# Patient Record
Sex: Male | Born: 1945 | Race: White | Hispanic: No | Marital: Single | Smoking: Never smoker
Health system: Southern US, Community
[De-identification: ages and names within clinical notes are randomized; demographics above are authoritative.]

## PROBLEM LIST (undated history)

## (undated) DIAGNOSIS — C4491 Basal cell carcinoma of skin, unspecified: Secondary | ICD-10-CM

## (undated) DIAGNOSIS — N419 Inflammatory disease of prostate, unspecified: Secondary | ICD-10-CM

## (undated) DIAGNOSIS — I10 Essential (primary) hypertension: Secondary | ICD-10-CM

## (undated) DIAGNOSIS — M1990 Unspecified osteoarthritis, unspecified site: Secondary | ICD-10-CM

## (undated) DIAGNOSIS — G4730 Sleep apnea, unspecified: Secondary | ICD-10-CM

## (undated) DIAGNOSIS — N400 Enlarged prostate without lower urinary tract symptoms: Secondary | ICD-10-CM

## (undated) DIAGNOSIS — D509 Iron deficiency anemia, unspecified: Secondary | ICD-10-CM

## (undated) DIAGNOSIS — E119 Type 2 diabetes mellitus without complications: Secondary | ICD-10-CM

## (undated) DIAGNOSIS — I2510 Atherosclerotic heart disease of native coronary artery without angina pectoris: Secondary | ICD-10-CM

## (undated) DIAGNOSIS — R361 Hematospermia: Secondary | ICD-10-CM

## (undated) DIAGNOSIS — D649 Anemia, unspecified: Secondary | ICD-10-CM

## (undated) DIAGNOSIS — E669 Obesity, unspecified: Secondary | ICD-10-CM

## (undated) DIAGNOSIS — E785 Hyperlipidemia, unspecified: Secondary | ICD-10-CM

## (undated) HISTORY — PX: CORONARY ANGIOPLASTY WITH STENT PLACEMENT: SHX49

## (undated) HISTORY — DX: Anemia, unspecified: D64.9

## (undated) HISTORY — PX: EYE SURGERY: SHX253

## (undated) HISTORY — DX: Type 2 diabetes mellitus without complications: E11.9

## (undated) HISTORY — DX: Obesity, unspecified: E66.9

## (undated) HISTORY — DX: Essential (primary) hypertension: I10

## (undated) HISTORY — DX: Hyperlipidemia, unspecified: E78.5

## (undated) HISTORY — DX: Hematospermia: R36.1

## (undated) HISTORY — DX: Benign prostatic hyperplasia without lower urinary tract symptoms: N40.0

## (undated) HISTORY — PX: OTHER SURGICAL HISTORY: SHX169

## (undated) HISTORY — DX: Inflammatory disease of prostate, unspecified: N41.9

## (undated) HISTORY — PX: GLAUCOMA SURGERY: SHX656

---

## 2005-01-06 ENCOUNTER — Ambulatory Visit: Payer: Self-pay | Admitting: Unknown Physician Specialty

## 2005-02-17 ENCOUNTER — Ambulatory Visit: Payer: Self-pay

## 2006-04-07 ENCOUNTER — Ambulatory Visit: Payer: Self-pay | Admitting: Unknown Physician Specialty

## 2006-10-03 ENCOUNTER — Ambulatory Visit: Payer: Self-pay

## 2008-01-21 ENCOUNTER — Ambulatory Visit: Payer: Self-pay | Admitting: Unknown Physician Specialty

## 2010-11-02 ENCOUNTER — Ambulatory Visit: Payer: Self-pay | Admitting: Unknown Physician Specialty

## 2011-09-15 ENCOUNTER — Ambulatory Visit: Payer: Self-pay | Admitting: Cardiology

## 2011-09-15 LAB — CK TOTAL AND CKMB (NOT AT ARMC): CK, Total: 133 U/L (ref 35–232)

## 2011-09-16 LAB — BASIC METABOLIC PANEL
Anion Gap: 10 (ref 7–16)
BUN: 13 mg/dL (ref 7–18)
Co2: 24 mmol/L (ref 21–32)
Creatinine: 0.53 mg/dL — ABNORMAL LOW (ref 0.60–1.30)
EGFR (African American): >60
EGFR (Non-African Amer.): >60
Glucose: 157 mg/dL — ABNORMAL HIGH (ref 65–99)
Osmolality: 283 (ref 275–301)

## 2011-12-21 DIAGNOSIS — R339 Retention of urine, unspecified: Secondary | ICD-10-CM | POA: Insufficient documentation

## 2011-12-21 DIAGNOSIS — N401 Enlarged prostate with lower urinary tract symptoms: Secondary | ICD-10-CM | POA: Insufficient documentation

## 2011-12-21 DIAGNOSIS — N411 Chronic prostatitis: Secondary | ICD-10-CM | POA: Insufficient documentation

## 2011-12-21 DIAGNOSIS — N509 Disorder of male genital organs, unspecified: Secondary | ICD-10-CM | POA: Insufficient documentation

## 2011-12-21 DIAGNOSIS — N138 Other obstructive and reflux uropathy: Secondary | ICD-10-CM | POA: Insufficient documentation

## 2013-02-20 ENCOUNTER — Ambulatory Visit: Payer: Self-pay | Admitting: Unknown Physician Specialty

## 2013-09-23 DIAGNOSIS — E785 Hyperlipidemia, unspecified: Secondary | ICD-10-CM | POA: Insufficient documentation

## 2013-10-27 DIAGNOSIS — I2510 Atherosclerotic heart disease of native coronary artery without angina pectoris: Secondary | ICD-10-CM | POA: Insufficient documentation

## 2013-11-05 ENCOUNTER — Ambulatory Visit: Payer: Self-pay | Admitting: Otolaryngology

## 2013-11-20 ENCOUNTER — Ambulatory Visit: Payer: Self-pay

## 2013-12-23 DIAGNOSIS — I10 Essential (primary) hypertension: Secondary | ICD-10-CM | POA: Insufficient documentation

## 2014-03-25 DIAGNOSIS — D649 Anemia, unspecified: Secondary | ICD-10-CM | POA: Insufficient documentation

## 2014-07-01 DIAGNOSIS — E782 Mixed hyperlipidemia: Secondary | ICD-10-CM | POA: Insufficient documentation

## 2014-10-02 DIAGNOSIS — E119 Type 2 diabetes mellitus without complications: Secondary | ICD-10-CM | POA: Insufficient documentation

## 2014-11-13 ENCOUNTER — Encounter: Payer: Self-pay | Admitting: *Deleted

## 2014-11-23 ENCOUNTER — Ambulatory Visit (INDEPENDENT_AMBULATORY_CARE_PROVIDER_SITE_OTHER): Payer: Medicare Other | Admitting: Urology

## 2014-11-23 ENCOUNTER — Encounter: Payer: Self-pay | Admitting: Urology

## 2014-11-23 VITALS — BP 129/72 | HR 72 | Ht 68.0 in | Wt 215.6 lb

## 2014-11-23 DIAGNOSIS — N401 Enlarged prostate with lower urinary tract symptoms: Secondary | ICD-10-CM

## 2014-11-23 DIAGNOSIS — R361 Hematospermia: Secondary | ICD-10-CM | POA: Insufficient documentation

## 2014-11-23 DIAGNOSIS — N138 Other obstructive and reflux uropathy: Secondary | ICD-10-CM | POA: Insufficient documentation

## 2014-11-23 LAB — BLADDER SCAN AMB NON-IMAGING

## 2014-11-23 MED ORDER — TAMSULOSIN HCL 0.4 MG PO CAPS: 0.4000 mg | ORAL_CAPSULE | Freq: Every day | ORAL | Status: DC

## 2014-11-23 MED ORDER — FINASTERIDE 5 MG PO TABS: 5.0000 mg | ORAL_TABLET | Freq: Every day | ORAL | Status: DC

## 2014-11-23 NOTE — Progress Notes (Signed)
11/23/2014 9:33 AM   Sherron Flemings [birth year] [id number]  Referring provider: No referring provider defined for this encounter.  Chief Complaint  Patient presents with  . Follow-up    6 month  . Benign Prostatic Hypertrophy    HPI: Mr. [place] is 69 year old white male with hematospermia and BPH with LUTS who presents today for a 6 month follow up.    BPH WITH LUTS: His IPSS score today is 16, which is moderate lower urinary tract symptomatology. He is mixed with his quality life due to his urinary symptoms. His PVR is 30 mL.  His previous IPSS score was 14/4.  His previous PVR is 18 mL.    His major complaint today having to use the restroom so often.   He has had these symptoms for several years.  He denies any dysuria, hematuria or suprapubic pain.   He currently taking tamsulosin and finasteride.  His has had a cystoscopic examination in 12/2012 by Dr. Jacqlyn Larsen and it was noted that the prostatic urethra demonstrated moderate bilobar prostatic hypertrophy, partial visual obstruction and prominent hypervascularity. Patient was placed on finasteride and was followed with PVR's.      He also denies any recent fevers, chills, nausea or vomiting.  He does not have a family history of PCa.      IPSS      11/23/14 0900       International Prostate Symptom Score   How often have you had the sensation of not emptying your bladder? Less than half the time     How often have you had to urinate less than every two hours? Almost always     How often have you found you stopped and started again several times when you urinated? Less than half the time     How often have you found it difficult to postpone urination? About half the time     How often have you had a weak urinary stream? Less than 1 in 5 times     How often have you had to strain to start urination? Less than 1 in 5 times     How many times did you typically get up at night to urinate? 2 Times     Total IPSS Score 16      Quality of Life due to urinary symptoms   If you were to spend the rest of your life with your urinary condition just the way it is now how would you feel about that? 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  When he reduced the dose of his ASA from 325 mg to 81 mg, hematospermia did abate.  He has since returned, but not at the frequency as it was before. He is not having pain with ejaculation.   PMH: Past Medical History  Diagnosis Date  . BPH (benign prostatic hyperplasia)   . Prostatitis   . Hematospermia   . Anemia   . Diabetes   . Hypertension   . HLD (hyperlipidemia)   . BPH (benign prostatic hyperplasia)   . Obesity     Surgical History: Past Surgical History  Procedure Laterality Date  . Rectal fissure repair    . Glaucoma surgery    . Coronary angioplasty with stent placement      Home Medications:    Medication List       This list is accurate as of: 11/23/14  9:33 AM.  Always use your most recent med list.               aspirin 81 MG tablet  Take 81 mg by mouth daily.     bimatoprost 0.01 % Soln  Commonly known as:  LUMIGAN  Apply to eye.     carvedilol 3.125 MG tablet  Commonly known as:  COREG  Take 3.125 mg by mouth 2 (two) times daily with a meal.     clopidogrel 75 MG tablet  Commonly known as:  PLAVIX  Take 75 mg by mouth daily.     COMBIGAN 0.2-0.5 % ophthalmic solution  Generic drug:  brimonidine-timolol     dorzolamide 2 % ophthalmic solution  Commonly known as:  TRUSOPT     DULoxetine 30 MG capsule  Commonly known as:  CYMBALTA  Take 2 capsules by mouth  once a day     ferrous gluconate 324 MG tablet  Commonly known as:  FERGON  Take 324 mg by mouth daily with breakfast.     ferrous sulfate 325 (65 FE) MG tablet  Take by mouth.     finasteride 5 MG tablet  Commonly known as:  PROSCAR  Take 5 mg by mouth daily.     gabapentin 300 MG capsule  Commonly known as:  NEURONTIN    Take 300 mg by mouth 3 (three) times daily.     glipiZIDE 5 MG tablet  Commonly known as:  GLUCOTROL  Take 5 mg by mouth daily before breakfast.     hydrochlorothiazide 12.5 MG capsule  Commonly known as:  MICROZIDE  TAKE ONE TABLET BY MOUTH EVERY DAY     ipratropium 0.03 % nasal spray  Commonly known as:  ATROVENT     lovastatin 40 MG tablet  Commonly known as:  MEVACOR  Take 40 mg by mouth at bedtime.     magnesium oxide 400 MG tablet  Commonly known as:  MAG-OX  Take by mouth.     metFORMIN 1000 MG tablet  Commonly known as:  GLUCOPHAGE  Take 1,000 mg by mouth 2 (two) times daily with a meal.     MULTI-VITAMINS Tabs  Take by mouth.     pantoprazole 40 MG tablet  Commonly known as:  PROTONIX  Take by mouth.     pioglitazone 45 MG tablet  Commonly known as:  ACTOS  Take 45 mg by mouth daily.     tamsulosin 0.4 MG Caps capsule  Commonly known as:  FLOMAX  Take 0.4 mg by mouth daily.     TRULICITY [place]  Inject into the skin.     valsartan 80 MG tablet  Commonly known as:  DIOVAN  Take 80 mg by mouth daily.        Allergies:  Allergies  Allergen Reactions  . Sulfa Antibiotics     Other reaction(s): UNSPECIFIED    Family History: Family History  Problem Relation Age of Onset  . Coronary artery disease Father   . Coronary artery disease Brother   . Nephrolithiasis Brother     Social History:  reports that he has never smoked. He does not have any smokeless tobacco history on file. He reports that he does not drink alcohol or use illicit drugs.  ROS: UROLOGY Frequent Urination?: Yes Hard to postpone urination?: Yes Burning/pain with urination?: No Get up at night to urinate?: Yes Leakage of urine?: No Urine stream starts and stops?: No Trouble starting stream?: No Do you have to strain to  urinate?: No Blood in urine?: No Urinary tract infection?: No Sexually transmitted disease?: No Injury to kidneys or bladder?: No Painful intercourse?:  No Weak stream?: No Erection problems?: Yes Penile pain?: No  Gastrointestinal Nausea?: No Vomiting?: No Indigestion/heartburn?: No Diarrhea?: No Constipation?: No  Constitutional Fever: No Night sweats?: No Weight loss?: No Fatigue?: No  Skin Skin rash/lesions?: No Itching?: No  Eyes Blurred vision?: No Double vision?: No  Ears/Nose/Throat Sore throat?: No Sinus problems?: No  Hematologic/Lymphatic Swollen glands?: No Easy bruising?: Yes  Cardiovascular Leg swelling?: No Chest pain?: No  Respiratory Cough?: No Shortness of breath?: No  Endocrine Excessive thirst?: No  Musculoskeletal Back pain?: No Joint pain?: No  Neurological Headaches?: No Dizziness?: No  Psychologic Depression?: No Anxiety?: No  Physical Exam: BP 129/72 mmHg  Pulse 72  Ht 5\' 8"  (1.727 m)  Wt 215 lb 9.6 oz (97.796 kg)  BMI 32.79 kg/m2  GU: Patient with uncircumcised phallus.  Foreskin easily retracted. Urethral meatus is patent.  No penile discharge. No penile lesions or rashes. Scrotum without lesions, cysts, rashes and/or edema.  Testicles are located scrotally bilaterally. No masses are appreciated in the testicles. Left and right epididymis are normal. Rectal: Patient with  normal sphincter tone. Perineum without scarring or rashes. No rectal masses are appreciated. Prostate is approximately 45 grams, no nodules are appreciated. Seminal vesicles are normal.   Laboratory Data:  Lab Results  Component Value Date   CREATININE 0.53* 09/16/2011    PSA   1.2 ng/mL on 05/25/2014  Pertinent Imaging: Results for orders placed or performed in visit on 11/23/14  BLADDER SCAN AMB NON-IMAGING  Result Value Ref Range   Scan Result 67ml     Assessment & Plan:    1. BPH (benign prostatic hyperplasia) with LUTS:   Patient's IPSS score is 16/3.  His PVR 30 mL.  His DRE demonstrates enlargement.    I discussed with the patient undergoing another cystoscopy with one of our  doctors for further evaluation of his frequency.  He stated his frequency was not that bothersome for him to undergo another cystoscopy.  He would like to wait until his symptoms worsen.   We will continue to monitor.   He will follow up in 6 months for a PSA, DRE, PVR and an IPSS.    - PSA - BLADDER SCAN AMB NON-IMAGING  2. Hematospermia:    I reassured the patient that this is a benign finding.   He still is quite bothered by seeing the blood in the ejaculate, but he appeared to satisfied with knowing that this is a benign finding.     Nursing note for RTC:      - IPSS score      - PVR      -PSA (should have been drawn prior to appointment)      -EXAM    No Follow-up on file.  Zara Council, [facility] [address] [place], [place] [address] [phone]

## 2014-11-24 ENCOUNTER — Telehealth: Payer: Self-pay

## 2014-11-24 LAB — PSA: PROSTATE SPECIFIC AG, SERUM: 0.5 ng/mL (ref 0.0–4.0)

## 2014-11-24 NOTE — Telephone Encounter (Signed)
-----   Message from Nori Riis, PA-C sent at 11/24/2014 11:22 AM EDT ----- Patient's PSA is stable.  We will see him in 6 months.  PSA to be drawn before his next appointment.

## 2014-11-24 NOTE — Telephone Encounter (Signed)
Spoke with pt in reference to PSA results. Pt voiced understanding.  

## 2014-11-25 ENCOUNTER — Other Ambulatory Visit: Payer: Self-pay

## 2014-11-25 DIAGNOSIS — N400 Enlarged prostate without lower urinary tract symptoms: Secondary | ICD-10-CM

## 2014-11-25 MED ORDER — TAMSULOSIN HCL 0.4 MG PO CAPS: 0.4000 mg | ORAL_CAPSULE | Freq: Every day | ORAL | Status: DC

## 2014-12-16 ENCOUNTER — Ambulatory Visit (INDEPENDENT_AMBULATORY_CARE_PROVIDER_SITE_OTHER): Payer: Medicare Other | Admitting: Urology

## 2014-12-16 ENCOUNTER — Encounter: Payer: Self-pay | Admitting: Urology

## 2014-12-16 VITALS — BP 93/55 | HR 41 | Ht 68.0 in | Wt 212.0 lb

## 2014-12-16 DIAGNOSIS — R300 Dysuria: Secondary | ICD-10-CM | POA: Diagnosis not present

## 2014-12-16 LAB — BLADDER SCAN AMB NON-IMAGING: Scan Result: 0

## 2014-12-16 MED ORDER — DOXYCYCLINE HYCLATE 100 MG PO CAPS: 100.0000 mg | ORAL_CAPSULE | Freq: Two times a day (BID) | ORAL | Status: DC

## 2014-12-16 NOTE — Progress Notes (Signed)
12/16/2014 2:26 PM   Brandon [place] June 11, 1945 [id number]  Referring provider: Glendon Axe, MD [place], [place] [address]  Chief Complaint  Patient presents with  . Dysuria    x 2 weeks     HPI: Patient is a 69 year old white male who presents today with 2 week history of  dysuria.  He is also experiencing urgency and nocturia 1. He denies any gross hematuria or suprapubic pain. He is having associated dizziness, but he denies fevers, chills, nausea or vomiting.  Patient was last seen in our office about one month ago for 6 month follow-up.  He was having urinary frequency at that time.  His PVR was 30 mL at that visit and he has been on finasteride and tamsulosin for several months.  He did not want to undergo cystoscopically for further evaluation of his symptoms. He wanted to wait until they were worse.  His PSA from that visit was 0.5 ng/mL.  Today his PVR is 0 mL.  His UA was unremarkable.  He has a history of chronic prostatitis and he states he believes he is having the onset of prostatitis.   PMH: Past Medical History  Diagnosis Date  . BPH (benign prostatic hyperplasia)   . Prostatitis   . Hematospermia   . Anemia   . Diabetes   . Hypertension   . HLD (hyperlipidemia)   . BPH (benign prostatic hyperplasia)   . Obesity     Surgical History: Past Surgical History  Procedure Laterality Date  . Rectal fissure repair    . Glaucoma surgery    . Coronary angioplasty with stent placement      Home Medications:    Medication List       This list is accurate as of: 12/16/14  2:26 PM.  Always use your most recent med list.               aspirin 81 MG tablet  Take 81 mg by mouth daily.     bimatoprost 0.01 % Soln  Commonly known as:  LUMIGAN  Apply to eye.     carvedilol 3.125 MG tablet  Commonly known as:  COREG  Take 3.125 mg by mouth 2 (two) times daily with a meal.     clopidogrel 75 MG tablet  Commonly known as:  PLAVIX    Take 75 mg by mouth daily.     COMBIGAN 0.2-0.5 % ophthalmic solution  Generic drug:  brimonidine-timolol     dorzolamide 2 % ophthalmic solution  Commonly known as:  TRUSOPT     doxycycline 100 MG capsule  Commonly known as:  VIBRAMYCIN  Take 1 capsule (100 mg total) by mouth every 12 (twelve) hours.     DULoxetine 30 MG capsule  Commonly known as:  CYMBALTA  Take 2 capsules by mouth  once a day     ferrous gluconate 324 MG tablet  Commonly known as:  FERGON  Take 324 mg by mouth daily with breakfast.     finasteride 5 MG tablet  Commonly known as:  PROSCAR  Take 1 tablet (5 mg total) by mouth daily.     gabapentin 300 MG capsule  Commonly known as:  NEURONTIN  Take 300 mg by mouth 3 (three) times daily.     glipiZIDE 5 MG 24 hr tablet  Commonly known as:  GLUCOTROL XL     hydrochlorothiazide 12.5 MG capsule  Commonly known as:  MICROZIDE  TAKE ONE TABLET BY  MOUTH EVERY DAY     ipratropium 0.03 % nasal spray  Commonly known as:  ATROVENT     latanoprost 0.005 % ophthalmic solution  Commonly known as:  XALATAN     lovastatin 40 MG tablet  Commonly known as:  MEVACOR  Take 40 mg by mouth at bedtime.     magnesium oxide 400 MG tablet  Commonly known as:  MAG-OX  Take by mouth.     metFORMIN 1000 MG tablet  Commonly known as:  GLUCOPHAGE  Take 1,000 mg by mouth 2 (two) times daily with a meal.     MULTI-VITAMINS Tabs  Take by mouth.     pantoprazole 40 MG tablet  Commonly known as:  PROTONIX  Take by mouth.     pioglitazone 45 MG tablet  Commonly known as:  ACTOS  Take 45 mg by mouth daily.     tamsulosin 0.4 MG Caps capsule  Commonly known as:  FLOMAX  Take 1 capsule (0.4 mg total) by mouth daily.     tamsulosin 0.4 MG Caps capsule  Commonly known as:  FLOMAX  Take 1 capsule (0.4 mg total) by mouth daily.     TRULICITY [place]  Inject into the skin.     TRULICITY 7.61 PJ/0.9TO Sopn  Generic drug:  Dulaglutide     valsartan 80 MG tablet   Commonly known as:  DIOVAN  Take 80 mg by mouth daily.        Allergies:  Allergies  Allergen Reactions  . Sulfa Antibiotics     Other reaction(s): UNSPECIFIED    Family History: Family History  Problem Relation Age of Onset  . Coronary artery disease Father   . Coronary artery disease Brother   . Nephrolithiasis Brother     Social History:  reports that he has never smoked. He does not have any smokeless tobacco history on file. He reports that he does not drink alcohol or use illicit drugs.  ROS: UROLOGY Frequent Urination?: No Hard to postpone urination?: Yes Burning/pain with urination?: Yes Get up at night to urinate?: Yes Leakage of urine?: No Urine stream starts and stops?: No Trouble starting stream?: No Do you have to strain to urinate?: No Blood in urine?: No Urinary tract infection?: No Sexually transmitted disease?: No Injury to kidneys or bladder?: No Painful intercourse?: No Weak stream?: No Erection problems?: No Penile pain?: No  Gastrointestinal Nausea?: No Vomiting?: No Indigestion/heartburn?: No Diarrhea?: No Constipation?: No  Constitutional Fever: No Night sweats?: No Weight loss?: No Fatigue?: No  Skin Skin rash/lesions?: No Itching?: No  Eyes Blurred vision?: No Double vision?: No  Ears/Nose/Throat Sore throat?: No Sinus problems?: No  Hematologic/Lymphatic Swollen glands?: No Easy bruising?: No  Cardiovascular Leg swelling?: No Chest pain?: No  Respiratory Cough?: No Shortness of breath?: No  Endocrine Excessive thirst?: No  Musculoskeletal Back pain?: No Joint pain?: No  Neurological Headaches?: No Dizziness?: Yes  Psychologic Depression?: No Anxiety?: No  Physical Exam: BP 93/55 mmHg  Pulse 41  Ht 5\' 8"  (1.727 m)  Wt 212 lb (96.163 kg)  BMI 32.24 kg/m2   Rectal: Patient with  normal sphincter tone. Perineum without scarring or rashes. No rectal masses are appreciated. Prostate is  approximately 50 grams, no nodules are appreciated. Seminal vesicles are normal.   Laboratory Data:  Lab Results  Component Value Date   CREATININE 0.53* 09/16/2011    Lab Results  Component Value Date   PSA 0.5 11/23/2014   Urinalysis:  Results for  orders placed or performed in visit on 12/16/14  Microscopic Examination  Result Value Ref Range   WBC, UA 0-5 0 -  5 /hpf   RBC, UA None seen 0 -  2 /hpf   Epithelial Cells (non renal) 0-10 0 - 10 /hpf   Casts Present (A) None seen /lpf   Cast Type Hyaline casts N/A   Mucus, UA Present (A) Not Estab.   Bacteria, UA None seen None seen/Few  Urinalysis, Complete  Result Value Ref Range   Specific Gravity, UA >1.030 (H) 1.005 - 1.030   pH, UA 5.0 5.0 - 7.5   Color, UA Yellow Yellow   Appearance Ur Clear Clear   Leukocytes, UA Negative Negative   Protein, UA Negative Negative/Trace   Glucose, UA Negative Negative   Ketones, UA 1+ (A) Negative   RBC, UA Negative Negative   Bilirubin, UA Positive (A) Negative   Urobilinogen, Ur 0.2 0.2 - 1.0 mg/dL   Nitrite, UA Negative Negative   Microscopic Examination See below:     Pertinent Imaging: Results for JUSTON, GOHEEN (MRN [id number]) as of 12/20/2014 17:10  Ref. Range 12/16/2014 14:09  Scan Result Unknown 0    Assessment & Plan:    1. Dysuria:   Patient states he is having symptoms reminiscent of chronic prostatitis that he has had in the past.  He states that his prostatitis has responded favorably to antibiotics.  I will prescribe doxycycline 100 mg twice daily for 30 days. Patient is advised of the increased photosensitivity with this antibiotic.  He will return in 1 month's time for repeat urinalysis and DRE.  - Urinalysis, Complete - Bladder Scan (Post Void Residual) in office   Return in about 1 month (around 01/15/2015) for DRE.  Zara Council, [facility] [address], [place], [place] [address] [phone]

## 2014-12-17 LAB — MICROSCOPIC EXAMINATION
BACTERIA UA: NONE SEEN
RBC, UA: NONE SEEN /hpf (ref 0–?)

## 2014-12-17 LAB — URINALYSIS, COMPLETE
BILIRUBIN UA: POSITIVE — AB
GLUCOSE, UA: NEGATIVE
LEUKOCYTES UA: NEGATIVE
Nitrite, UA: NEGATIVE
Protein, UA: NEGATIVE
RBC UA: NEGATIVE
Urobilinogen, Ur: 0.2 mg/dL (ref 0.2–1.0)
pH, UA: 5 (ref 5.0–7.5)

## 2014-12-20 DIAGNOSIS — R300 Dysuria: Secondary | ICD-10-CM | POA: Insufficient documentation

## 2015-01-21 ENCOUNTER — Encounter: Payer: Self-pay | Admitting: Urology

## 2015-01-21 ENCOUNTER — Ambulatory Visit (INDEPENDENT_AMBULATORY_CARE_PROVIDER_SITE_OTHER): Payer: Medicare Other | Admitting: Urology

## 2015-01-21 VITALS — BP 108/62 | HR 67 | Ht 68.0 in | Wt 217.4 lb

## 2015-01-21 DIAGNOSIS — N411 Chronic prostatitis: Secondary | ICD-10-CM | POA: Diagnosis not present

## 2015-01-21 LAB — URINALYSIS, COMPLETE
BILIRUBIN UA: NEGATIVE
LEUKOCYTES UA: NEGATIVE
Nitrite, UA: NEGATIVE
PH UA: 5 (ref 5.0–7.5)
PROTEIN UA: NEGATIVE
RBC UA: NEGATIVE
Specific Gravity, UA: 1.025 (ref 1.005–1.030)
Urobilinogen, Ur: 0.2 mg/dL (ref 0.2–1.0)

## 2015-01-21 LAB — MICROSCOPIC EXAMINATION
RBC, UA: NONE SEEN /hpf (ref 0–?)
RENAL EPITHEL UA: NONE SEEN /HPF
WBC, UA: NONE SEEN /hpf (ref 0–?)

## 2015-01-21 NOTE — Progress Notes (Signed)
01/21/2015 10:12 AM   Br[place] [birth year] [id number]  Referring provider: Glendon Axe, MD [facility] [place], [place] [address]  Chief Complaint  Patient presents with  . Prostatitis    one month follow up    HPI: Patient 69 year old white male who presents today after being placed on doxycycline for one month for prostatitis.  He was having dysuria, urgency and nocturia 1. He was having some associated dizziness, but he denied fevers, chills, nausea or vomiting. He also denied any gross hematuria or suprapubic pain associated with prostatitis.  Today, he states his symptoms have abated. His UA is unremarkable. He did not suffer any untoward side effects with the doxycycline.  He is still having intermittent brown colored semen.  He is not having any difficulty or pain with ejaculation.   PMH: Past Medical History  Diagnosis Date  . BPH (benign prostatic hyperplasia)   . Prostatitis   . Hematospermia   . Anemia   . Diabetes ([place])   . Hypertension   . HLD (hyperlipidemia)   . BPH (benign prostatic hyperplasia)   . Obesity     Surgical History: Past Surgical History  Procedure Laterality Date  . Rectal fissure repair    . Glaucoma surgery    . Coronary angioplasty with stent placement      Home Medications:    Medication List       This list is accurate as of: 01/21/15 10:12 AM.  Always use your most recent med list.               aspirin 81 MG tablet  Take 81 mg by mouth daily.     bimatoprost 0.01 % Soln  Commonly known as:  LUMIGAN  Apply to eye.     carvedilol 3.125 MG tablet  Commonly known as:  COREG  Take 3.125 mg by mouth 2 (two) times daily with a meal.     clopidogrel 75 MG tablet  Commonly known as:  PLAVIX  Take 75 mg by mouth daily.     COMBIGAN 0.2-0.5 % ophthalmic solution  Generic drug:  brimonidine-timolol     dorzolamide 2 % ophthalmic solution  Commonly known as:  TRUSOPT     doxycycline 100 MG capsule  Commonly known as:  VIBRAMYCIN  Take 1 capsule (100 mg total) by mouth every 12 (twelve) hours.     DULoxetine 30 MG capsule  Commonly known as:  CYMBALTA  Take 2 capsules by mouth  once a day     ferrous gluconate 324 MG tablet  Commonly known as:  FERGON  Take 324 mg by mouth daily with breakfast.     finasteride 5 MG tablet  Commonly known as:  PROSCAR  Take 1 tablet (5 mg total) by mouth daily.     gabapentin 300 MG capsule  Commonly known as:  NEURONTIN  Take 400 mg by mouth 3 (three) times daily.     glipiZIDE 5 MG 24 hr tablet  Commonly known as:  GLUCOTROL XL     hydrochlorothiazide 12.5 MG capsule  Commonly known as:  MICROZIDE  TAKE ONE TABLET BY MOUTH EVERY DAY     ipratropium 0.03 % nasal spray  Commonly known as:  ATROVENT     latanoprost 0.005 % ophthalmic solution  Commonly known as:  XALATAN     lovastatin 40 MG tablet  Commonly known as:  MEVACOR  Take 40 mg by mouth at bedtime.  magnesium oxide 400 MG tablet  Commonly known as:  MAG-OX  Take by mouth.     metFORMIN 1000 MG tablet  Commonly known as:  GLUCOPHAGE  Take 1,000 mg by mouth 2 (two) times daily with a meal.     MULTI-VITAMINS Tabs  Take by mouth.     pantoprazole 40 MG tablet  Commonly known as:  PROTONIX  Take by mouth.     pioglitazone 45 MG tablet  Commonly known as:  ACTOS  Take 45 mg by mouth daily.     tamsulosin 0.4 MG Caps capsule  Commonly known as:  FLOMAX  Take 1 capsule (0.4 mg total) by mouth daily.     tamsulosin 0.4 MG Caps capsule  Commonly known as:  FLOMAX  Take 1 capsule (0.4 mg total) by mouth daily.     TRULICITY [place]  Inject into the skin.     TRULICITY 1.09 NA/3.5TD Sopn  Generic drug:  Dulaglutide     valsartan 80 MG tablet  Commonly known as:  DIOVAN  Take 80 mg by mouth daily.        Allergies:  Allergies  Allergen Reactions  . Sulfa Antibiotics     Other reaction(s): UNSPECIFIED    Family  History: Family History  Problem Relation Age of Onset  . Coronary artery disease Father   . Coronary artery disease Brother   . Nephrolithiasis Brother   . Kidney disease Neg Hx   . Prostate cancer Neg Hx     Social History:  reports that he has never smoked. He does not have any smokeless tobacco history on file. He reports that he does not drink alcohol or use illicit drugs.  ROS: UROLOGY Frequent Urination?: Yes Hard to postpone urination?: Yes Burning/pain with urination?: No Get up at night to urinate?: Yes Leakage of urine?: No Urine stream starts and stops?: No Trouble starting stream?: No Do you have to strain to urinate?: No Blood in urine?: No Urinary tract infection?: No Sexually transmitted disease?: No Injury to kidneys or bladder?: No Painful intercourse?: No Weak stream?: No Erection problems?: No Penile pain?: No  Gastrointestinal Nausea?: No Vomiting?: No Indigestion/heartburn?: No Diarrhea?: No Constipation?: No  Constitutional Fever: No Night sweats?: No Weight loss?: No Fatigue?: No  Skin Skin rash/lesions?: No Itching?: No  Eyes Blurred vision?: No Double vision?: No  Ears/Nose/Throat Sore throat?: No Sinus problems?: No  Hematologic/Lymphatic Swollen glands?: No Easy bruising?: Yes  Cardiovascular Leg swelling?: No Chest pain?: No  Respiratory Cough?: No Shortness of breath?: No  Endocrine Excessive thirst?: No  Musculoskeletal Back pain?: No Joint pain?: Yes  Neurological Headaches?: No Dizziness?: No  Psychologic Depression?: No Anxiety?: No  Physical Exam: BP 108/62 mmHg  Pulse 67  Ht 5\' 8"  (1.727 m)  Wt 217 lb 6.4 oz (98.612 kg)  BMI 33.06 kg/m2  Rectal: Patient with  normal sphincter tone. Perineum without scarring or rashes. No rectal masses are appreciated. Prostate is approximately 45 grams, no nodules are appreciated. Seminal vesicles are normal.   Laboratory Data:  Lab Results  Component  Value Date   PSA 0.5 11/23/2014   Urinalysis: Results for orders placed or performed in visit on 01/21/15  Microscopic Examination  Result Value Ref Range   WBC, UA None seen 0 -  5 /hpf   RBC, UA None seen 0 -  2 /hpf   Epithelial Cells (non renal) 0-10 0 - 10 /hpf   Renal Epithel, UA None seen None seen /hpf  Bacteria, UA Few (A) None seen/Few  Urinalysis, Complete  Result Value Ref Range   Specific Gravity, UA 1.025 1.005 - 1.030   pH, UA 5.0 5.0 - 7.5   Color, UA Orange Yellow   Appearance Ur Clear Clear   Leukocytes, UA Negative Negative   Protein, UA Negative Negative/Trace   Glucose, UA Trace (A) Negative   Ketones, UA 1+ (A) Negative   RBC, UA Negative Negative   Bilirubin, UA Negative Negative   Urobilinogen, Ur 0.2 0.2 - 1.0 mg/dL   Nitrite, UA Negative Negative   Microscopic Examination See below:     Assessment & Plan:    1. Chronic prostatitis:   Patient's symptoms of prostatitis have abated with the doxycycline. He did not experience any side effects with the doxycycline. He return in 3 months for his IP assess, DRE and PSA.  - Urinalysis, Complete   Return in about 3 months (around 04/23/2015) for IPSS and DRE.  Zara Council, [facility] [address] [place], [place] [address] [phone]

## 2015-04-20 DIAGNOSIS — J3089 Other allergic rhinitis: Secondary | ICD-10-CM | POA: Insufficient documentation

## 2015-05-21 ENCOUNTER — Other Ambulatory Visit: Payer: Self-pay

## 2015-05-21 DIAGNOSIS — R972 Elevated prostate specific antigen [PSA]: Secondary | ICD-10-CM

## 2015-05-24 ENCOUNTER — Other Ambulatory Visit: Payer: Medicare Other

## 2015-05-24 DIAGNOSIS — R972 Elevated prostate specific antigen [PSA]: Secondary | ICD-10-CM

## 2015-05-25 LAB — PSA: Prostate Specific Ag, Serum: 0.6 ng/mL (ref 0.0–4.0)

## 2015-05-26 ENCOUNTER — Encounter: Payer: Self-pay | Admitting: Urology

## 2015-05-26 ENCOUNTER — Ambulatory Visit (INDEPENDENT_AMBULATORY_CARE_PROVIDER_SITE_OTHER): Payer: Medicare Other | Admitting: Urology

## 2015-05-26 VITALS — Ht 68.0 in | Wt 215.9 lb

## 2015-05-26 DIAGNOSIS — R361 Hematospermia: Secondary | ICD-10-CM

## 2015-05-26 DIAGNOSIS — N138 Other obstructive and reflux uropathy: Secondary | ICD-10-CM

## 2015-05-26 DIAGNOSIS — N401 Enlarged prostate with lower urinary tract symptoms: Secondary | ICD-10-CM | POA: Diagnosis not present

## 2015-05-26 MED ORDER — FINASTERIDE 5 MG PO TABS: 5.0000 mg | ORAL_TABLET | Freq: Every day | ORAL | Status: DC

## 2015-05-26 NOTE — Progress Notes (Signed)
10:09 AM   Brandon Maxwell 1945/06/17 [id number]  Referring provider: Glendon Axe, MD Brandon Maxwell [place], [place] [address]  Chief Complaint  Patient presents with  . Benign Prostatic Hypertrophy    6 month follow up    HPI: Brandon Maxwell is 70 year old white male with hematospermia and BPH with LUTS who presents today for a 6 month follow up.    BPH WITH LUTS: His IPSS score today is 15, which is moderate lower urinary tract symptomatology. He is mixed with his quality life due to his urinary symptoms. His previous PVR is 0 mL.  His major complaint today having to find a restroom he goes for long car rides.   He has had these symptoms for several years.  He denies any dysuria, hematuria or suprapubic pain.  He currently taking finasteride.  His has had a cystoscopic examination in 12/2012 by Brandon Maxwell and it was noted that the prostatic urethra demonstrated moderate bilobar prostatic hypertrophy, partial visual obstruction and prominent hypervascularity. Patient was placed on finasteride and was followed with PVR's.  He also denies any recent fevers, chills, nausea or vomiting.  He does not have a family history of PCa.      IPSS      05/26/15 1000       International Prostate Symptom Score   How often have you had the sensation of not emptying your bladder? Less than 1 in 5     How often have you had to urinate less than every two hours? About half the time     How often have you found you stopped and started again several times when you urinated? About half the time     How often have you found it difficult to postpone urination? About half the time     How often have you had a weak urinary stream? Less than half the time     How often have you had to strain to start urination? Less than half the time     How many times did you typically get up at night to urinate? 1 Time     Total IPSS Score 15     Quality of Life due to urinary symptoms   If  you were to spend the rest of your life with your urinary condition just the way it is now how would you feel about that? 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  When he reduced the dose of his ASA from 325 mg to 81 mg, hematospermia did abate.  Hematospermia is not at the frequency as it was before. He is not having pain or curvature with ejaculation.   PMH: Past Medical History  Diagnosis Date  . BPH (benign prostatic hyperplasia)   . Prostatitis   . Hematospermia   . Anemia   . Diabetes ([place])   . Hypertension   . HLD (hyperlipidemia)   . BPH (benign prostatic hyperplasia)   . Obesity     Surgical History: Past Surgical History  Procedure Laterality Date  . Rectal fissure repair    . Glaucoma surgery    . Coronary angioplasty with stent placement      Home Medications:    Medication List       This list is accurate as of: 05/26/15 10:09 AM.  Always use your most recent med list.  aspirin 81 MG tablet  Take 81 mg by mouth daily.     bimatoprost 0.01 % Soln  Commonly known as:  LUMIGAN  Apply to eye.     carvedilol 3.125 MG tablet  Commonly known as:  COREG  Take 3.125 mg by mouth 2 (two) times daily with a meal.     clopidogrel 75 MG tablet  Commonly known as:  PLAVIX  Take 75 mg by mouth daily.     COMBIGAN 0.2-0.5 % ophthalmic solution  Generic drug:  brimonidine-timolol     dorzolamide 2 % ophthalmic solution  Commonly known as:  TRUSOPT     doxycycline 100 MG capsule  Commonly known as:  VIBRAMYCIN  Take 1 capsule (100 mg total) by mouth every 12 (twelve) hours.     DULoxetine 30 MG capsule  Commonly known as:  CYMBALTA  Reported on 05/26/2015     ferrous gluconate 324 MG tablet  Commonly known as:  FERGON  Take 324 mg by mouth daily with breakfast.     finasteride 5 MG tablet  Commonly known as:  PROSCAR  Take 1 tablet (5 mg total) by mouth daily.     gabapentin 400 MG capsule    Commonly known as:  NEURONTIN     glipiZIDE 5 MG 24 hr tablet  Commonly known as:  GLUCOTROL XL     hydrochlorothiazide 12.5 MG capsule  Commonly known as:  MICROZIDE  TAKE ONE TABLET BY MOUTH EVERY DAY     ipratropium 0.03 % nasal spray  Commonly known as:  ATROVENT     latanoprost 0.005 % ophthalmic solution  Commonly known as:  XALATAN     levocetirizine 5 MG tablet  Commonly known as:  XYZAL  Take by mouth.     lovastatin 40 MG tablet  Commonly known as:  MEVACOR  Take 40 mg by mouth at bedtime.     magnesium oxide 400 MG tablet  Commonly known as:  MAG-OX  Take by mouth.     meloxicam 7.5 MG tablet  Commonly known as:  MOBIC     metFORMIN 1000 MG tablet  Commonly known as:  GLUCOPHAGE  Take 1,000 mg by mouth 2 (two) times daily with a meal. Reported on 05/26/2015     mometasone 0.1 % lotion  Commonly known as:  ELOCON  Reported on 05/26/2015     MULTI-VITAMINS Tabs  Take by mouth.     pantoprazole 40 MG tablet  Commonly known as:  PROTONIX  Take by mouth.     pioglitazone 45 MG tablet  Commonly known as:  ACTOS  Take 45 mg by mouth daily.     tamsulosin 0.4 MG Caps capsule  Commonly known as:  FLOMAX  Take 1 capsule (0.4 mg total) by mouth daily.     tamsulosin 0.4 MG Caps capsule  Commonly known as:  FLOMAX  Take 1 capsule (0.4 mg total) by mouth daily.     TRULICITY [place]  Inject into the skin.     TRULICITY A999333 0000000 Sopn  Generic drug:  Dulaglutide     valsartan 80 MG tablet  Commonly known as:  DIOVAN  Take 80 mg by mouth daily.        Allergies:  Allergies  Allergen Reactions  . Sulfa Antibiotics     Other reaction(s): UNSPECIFIED    Family History: Family History  Problem Relation Age of Onset  . Coronary artery disease Father   . Coronary artery disease Brother   .  Nephrolithiasis Brother   . Kidney disease Neg Hx   . Prostate cancer Neg Hx     Social History:  reports that he has never smoked. He does not have any  smokeless tobacco history on file. He reports that he does not drink alcohol or use illicit drugs.  ROS: UROLOGY Frequent Urination?: Yes Hard to postpone urination?: No Burning/pain with urination?: No Get up at night to urinate?: No Leakage of urine?: Yes Urine stream starts and stops?: Yes Trouble starting stream?: No Do you have to strain to urinate?: No Blood in urine?: No Urinary tract infection?: No Sexually transmitted disease?: No Injury to kidneys or bladder?: No Painful intercourse?: No Weak stream?: No Erection problems?: No Penile pain?: No  Gastrointestinal Nausea?: No Vomiting?: No Indigestion/heartburn?: No Diarrhea?: No Constipation?: No  Constitutional Fever: No Night sweats?: No Weight loss?: No Fatigue?: No  Skin Skin rash/lesions?: No Itching?: No  Eyes Blurred vision?: No Double vision?: No  Ears/Nose/Throat Sore throat?: No Sinus problems?: No  Hematologic/Lymphatic Swollen glands?: No Easy bruising?: No  Cardiovascular Leg swelling?: No Chest pain?: No  Respiratory Cough?: No Shortness of breath?: No  Endocrine Excessive thirst?: No  Musculoskeletal Back pain?: Yes Joint pain?: Yes  Neurological Headaches?: No Dizziness?: No  Psychologic Depression?: No Anxiety?: No  Physical Exam: Ht 5\' 8"  (1.727 m)  Wt 215 lb 14.4 oz (97.932 kg)  BMI 32.84 kg/m2  GU: Patient with uncircumcised phallus.  Foreskin easily retracted. Urethral meatus is patent.  No penile discharge. No penile lesions or rashes. Scrotum without lesions, cysts, rashes and/or edema.  Testicles are located scrotally bilaterally. No masses are appreciated in the testicles. Left and right epididymis are normal. Rectal: Patient with  normal sphincter tone. Perineum without scarring or rashes. No rectal masses are appreciated. Prostate is approximately 45 grams, no nodules are appreciated. Seminal vesicles are normal.   Laboratory Data:  Lab Results    Component Value Date   CREATININE 0.53* 09/16/2011    PSA   1.2 ng/mL on 05/25/2014            0.5 ng/mL on 11/23/2014 Results for orders placed or performed in visit on 05/24/15  PSA  Result Value Ref Range   Prostate Specific Ag, Serum 0.6 0.0 - 4.0 ng/mL    Assessment & Plan:    1. BPH (benign prostatic hyperplasia) with LUTS:   Patient's IPSS score is 15/3.  He will continue the finasteride.   We will continue to monitor.   He will follow up in 6 months for a PSA, exam and an IPSS score.     2. Hematospermia:    He is satisfied with knowing that this is a benign finding.     Return in about 6 months (around 11/23/2015) for IPSS score and exam.  Zara Council, [facility] [address], [place], [place] [address] [phone]

## 2015-06-23 ENCOUNTER — Other Ambulatory Visit: Payer: Self-pay | Admitting: Urology

## 2015-06-23 NOTE — Telephone Encounter (Signed)
Do you want pt to continue medication? I saw about the finasteride.

## 2015-07-30 DIAGNOSIS — R42 Dizziness and giddiness: Secondary | ICD-10-CM | POA: Insufficient documentation

## 2015-10-08 DIAGNOSIS — E7800 Pure hypercholesterolemia, unspecified: Secondary | ICD-10-CM | POA: Insufficient documentation

## 2015-11-17 ENCOUNTER — Other Ambulatory Visit: Payer: Self-pay

## 2015-11-17 DIAGNOSIS — N400 Enlarged prostate without lower urinary tract symptoms: Secondary | ICD-10-CM

## 2015-11-19 ENCOUNTER — Other Ambulatory Visit: Payer: Medicare Other

## 2015-11-19 DIAGNOSIS — N400 Enlarged prostate without lower urinary tract symptoms: Secondary | ICD-10-CM

## 2015-11-20 LAB — PSA: Prostate Specific Ag, Serum: 0.8 ng/mL (ref 0.0–4.0)

## 2015-11-23 ENCOUNTER — Ambulatory Visit: Payer: Medicare Other | Admitting: Urology

## 2015-11-23 ENCOUNTER — Ambulatory Visit (INDEPENDENT_AMBULATORY_CARE_PROVIDER_SITE_OTHER): Payer: Medicare Other | Admitting: Urology

## 2015-11-23 ENCOUNTER — Encounter: Payer: Self-pay | Admitting: Urology

## 2015-11-23 VITALS — BP 111/70 | HR 86 | Ht 68.0 in | Wt 211.1 lb

## 2015-11-23 DIAGNOSIS — R361 Hematospermia: Secondary | ICD-10-CM | POA: Diagnosis not present

## 2015-11-23 DIAGNOSIS — N401 Enlarged prostate with lower urinary tract symptoms: Secondary | ICD-10-CM | POA: Diagnosis not present

## 2015-11-23 DIAGNOSIS — N138 Other obstructive and reflux uropathy: Secondary | ICD-10-CM

## 2015-11-23 MED ORDER — TAMSULOSIN HCL 0.4 MG PO CAPS: 0.4000 mg | ORAL_CAPSULE | Freq: Every day | ORAL | 4 refills | Status: DC

## 2015-11-23 MED ORDER — FINASTERIDE 5 MG PO TABS: 5.0000 mg | ORAL_TABLET | Freq: Every day | ORAL | 4 refills | Status: DC

## 2015-11-23 NOTE — Progress Notes (Signed)
1:48 PM   Brandon Maxwell 03/06/1946 [id number]  Referring provider: Glendon Axe, MD Brandon Maxwell, [place] [address]  Chief Complaint  Patient presents with  . Follow-up    HPI: Brandon Maxwell is 70 year old Caucasian male with hematospermia and BPH with LUTS who presents today for a 6 month follow up.    BPH WITH LUTS: His IPSS score today is 10, which is moderate lower urinary tract symptomatology. He is mostly satisfied with his quality life due to his urinary symptoms. His previous IPSS score was 15/3.  His previous PVR is 0 mL.  His complaint today is frequency, urgency and nocturia.  These are not bothersome to him.  He denies any dysuria, hematuria or suprapubic pain.  He currently taking finasteride and tamsulosin.   His has had a cystoscopic examination in 12/2012 by Dr. Jacqlyn Larsen and it was noted that the prostatic urethra demonstrated moderate bilobar prostatic hypertrophy, partial visual obstruction and prominent hypervascularity. Patient was placed on finasteride and was followed with PVR's.  He also denies any recent fevers, chills, nausea or vomiting.  He does not have a family history of PCa.      IPSS    Row Name 11/23/15 1300         International Prostate Symptom Score   How often have you had the sensation of not emptying your bladder? Less than 1 in 5     How often have you had to urinate less than every two hours? About half the time     How often have you found you stopped and started again several times when you urinated? Less than 1 in 5 times     How often have you found it difficult to postpone urination? Less than half the time     How often have you had a weak urinary stream? Less than half the time     How often have you had to strain to start urination? Not at All     How many times did you typically get up at night to urinate? 1 Time     Total IPSS Score 10       Quality of Life due to urinary symptoms   If you were  to spend the rest of your life with your urinary condition just the way it is now how would you feel about that? Mostly Satisfied        Hematospermia: Patient has had intermittent episodes of hematospermia.  When he reduced the dose of his ASA from 325 mg to 81 mg, hematospermia did abate.  Hematospermia has abated. He is not having pain or curvature with ejaculation.   PMH: Past Medical History:  Diagnosis Date  . Anemia   . BPH (benign prostatic hyperplasia)   . BPH (benign prostatic hyperplasia)   . Diabetes ([place])   . Hematospermia   . HLD (hyperlipidemia)   . Hypertension   . Obesity   . Prostatitis     Surgical History: Past Surgical History:  Procedure Laterality Date  . CORONARY ANGIOPLASTY WITH STENT PLACEMENT    . GLAUCOMA SURGERY    . rectal fissure repair      Home Medications:    Medication List       Accurate as of 11/23/15  1:48 PM. Always use your most recent med list.          aspirin 81 MG tablet Take 81 mg by mouth daily.  bimatoprost 0.01 % Soln Commonly known as:  LUMIGAN Apply to eye.   carvedilol 3.125 MG tablet Commonly known as:  COREG Take 3.125 mg by mouth 2 (two) times daily with a meal.   clopidogrel 75 MG tablet Commonly known as:  PLAVIX Take 75 mg by mouth daily.   COMBIGAN 0.2-0.5 % ophthalmic solution Generic drug:  brimonidine-timolol   dorzolamide 2 % ophthalmic solution Commonly known as:  TRUSOPT   doxycycline 100 MG capsule Commonly known as:  VIBRAMYCIN Take 1 capsule (100 mg total) by mouth every 12 (twelve) hours.   DULoxetine 30 MG capsule Commonly known as:  CYMBALTA Reported on 05/26/2015   ferrous gluconate 324 MG tablet Commonly known as:  FERGON Take 324 mg by mouth daily with breakfast.   finasteride 5 MG tablet Commonly known as:  PROSCAR Take 1 tablet (5 mg total) by mouth daily.   gabapentin 400 MG capsule Commonly known as:  NEURONTIN   glipiZIDE 5 MG 24 hr tablet Commonly known as:   GLUCOTROL XL   hydrochlorothiazide 12.5 MG capsule Commonly known as:  MICROZIDE TAKE ONE TABLET BY MOUTH EVERY DAY   ipratropium 0.03 % nasal spray Commonly known as:  ATROVENT   latanoprost 0.005 % ophthalmic solution Commonly known as:  XALATAN   levocetirizine 5 MG tablet Commonly known as:  XYZAL Take by mouth.   lovastatin 40 MG tablet Commonly known as:  MEVACOR Take 40 mg by mouth at bedtime.   meloxicam 7.5 MG tablet Commonly known as:  MOBIC   metFORMIN 1000 MG tablet Commonly known as:  GLUCOPHAGE Take 1,000 mg by mouth 2 (two) times daily with a meal. Reported on 05/26/2015   mometasone 0.1 % lotion Commonly known as:  ELOCON Reported on 05/26/2015   MULTI-VITAMINS Tabs Take by mouth.   pantoprazole 40 MG tablet Commonly known as:  PROTONIX Take by mouth.   pioglitazone 45 MG tablet Commonly known as:  ACTOS Take 45 mg by mouth daily.   tamsulosin 0.4 MG Caps capsule Commonly known as:  FLOMAX Take 1 capsule by mouth  daily   tamsulosin 0.4 MG Caps capsule Commonly known as:  FLOMAX Take 1 capsule (0.4 mg total) by mouth daily.   TRULICITY [place] Inject into the skin.   TRULICITY A999333 0000000 Sopn Generic drug:  Dulaglutide   valsartan 80 MG tablet Commonly known as:  DIOVAN Take 80 mg by mouth daily.       Allergies:  Allergies  Allergen Reactions  . Sulfa Antibiotics     Other reaction(s): UNSPECIFIED    Family History: Family History  Problem Relation Age of Onset  . Coronary artery disease Father   . Coronary artery disease Brother   . Nephrolithiasis Brother   . Kidney disease Neg Hx   . Prostate cancer Neg Hx     Social History:  reports that he has never smoked. He has never used smokeless tobacco. He reports that he does not drink alcohol or use drugs.  ROS: UROLOGY Frequent Urination?: Yes Hard to postpone urination?: Yes Burning/pain with urination?: No Get up at night to urinate?: Yes Leakage of urine?:  No Urine stream starts and stops?: No Trouble starting stream?: No Do you have to strain to urinate?: No Blood in urine?: No Urinary tract infection?: No Sexually transmitted disease?: No Injury to kidneys or bladder?: No Painful intercourse?: No Weak stream?: No Erection problems?: Yes Penile pain?: No  Gastrointestinal Nausea?: No Vomiting?: No Indigestion/heartburn?: No Diarrhea?: No Constipation?:  No  Constitutional Fever: No Night sweats?: No Weight loss?: No Fatigue?: No  Skin Skin rash/lesions?: No Itching?: No  Eyes Blurred vision?: No Double vision?: No  Ears/Nose/Throat Sore throat?: No Sinus problems?: No  Hematologic/Lymphatic Swollen glands?: No Easy bruising?: Yes  Cardiovascular Leg swelling?: No Chest pain?: No  Respiratory Cough?: No Shortness of breath?: No  Endocrine Excessive thirst?: No  Musculoskeletal Back pain?: Yes Joint pain?: Yes  Neurological Headaches?: No Dizziness?: Yes  Psychologic Depression?: No Anxiety?: No  Physical Exam: BP 111/70   Pulse 86   Ht 5\' 8"  (1.727 m)   Wt 211 lb 1.6 oz (95.8 kg)   BMI 32.10 kg/m   GU: Patient with uncircumcised phallus.  Foreskin easily retracted. Urethral meatus is patent.  No penile discharge. No penile lesions or rashes. Scrotum without lesions, cysts, rashes and/or edema.  Testicles are located scrotally bilaterally. No masses are appreciated in the testicles. Left and right epididymis are normal. Rectal: Patient with  normal sphincter tone. Perineum without scarring or rashes. No rectal masses are appreciated. Prostate is approximately 45 grams, no nodules are appreciated. Seminal vesicles are normal.   Laboratory Data:  Lab Results  Component Value Date   CREATININE 0.53 (L) 09/16/2011    PSA   1.2 ng/mL on 05/25/2014            0.5 ng/mL on 11/23/2014 Results for orders placed or performed in visit on 11/19/15  PSA  Result Value Ref Range   Prostate Specific  Ag, Serum 0.8 0.0 - 4.0 ng/mL    Assessment & Plan:    1. BPH (benign prostatic hyperplasia) with LUTS:   Patient's IPSS score is 10/2.  He will continue the finasteride and tamsulosin.  Refills given.  He will follow up in 12 months for a PSA, exam and an IPSS score.     2. Hematospermia:    Resolved.    Return in about 1 year (around 11/22/2016) for IPSS, PSA and exam.  Zara Council, [facility]  [facility] [address] [place], [place] [address] [phone]

## 2016-08-24 ENCOUNTER — Other Ambulatory Visit: Payer: Self-pay | Admitting: Student

## 2016-08-24 DIAGNOSIS — K30 Functional dyspepsia: Secondary | ICD-10-CM

## 2016-08-25 ENCOUNTER — Other Ambulatory Visit
Admission: RE | Admit: 2016-08-25 | Discharge: 2016-08-25 | Disposition: A | Discharge status: Home / Self Care | Payer: Medicare Other | Source: Ambulatory Visit | Attending: Student | Admitting: Student

## 2016-08-25 DIAGNOSIS — R194 Change in bowel habit: Secondary | ICD-10-CM | POA: Insufficient documentation

## 2016-08-25 LAB — GASTROINTESTINAL PANEL BY PCR, STOOL (REPLACES STOOL CULTURE)
ADENOVIRUS F40/41: NOT DETECTED
Astrovirus: NOT DETECTED
CRYPTOSPORIDIUM: NOT DETECTED
CYCLOSPORA CAYETANENSIS: NOT DETECTED
Campylobacter species: NOT DETECTED
ENTEROAGGREGATIVE E COLI (EAEC): NOT DETECTED
ENTEROPATHOGENIC E COLI (EPEC): NOT DETECTED
Entamoeba histolytica: NOT DETECTED
Enterotoxigenic E coli (ETEC): NOT DETECTED
Giardia lamblia: NOT DETECTED
Norovirus GI/GII: NOT DETECTED
PLESIMONAS SHIGELLOIDES: NOT DETECTED
ROTAVIRUS A: NOT DETECTED
SHIGELLA/ENTEROINVASIVE E COLI (EIEC): NOT DETECTED
Salmonella species: NOT DETECTED
Sapovirus (I, II, IV, and V): NOT DETECTED
Shiga like toxin producing E coli (STEC): NOT DETECTED
VIBRIO SPECIES: NOT DETECTED
Vibrio cholerae: NOT DETECTED
YERSINIA ENTEROCOLITICA: NOT DETECTED

## 2016-08-25 LAB — C DIFFICILE QUICK SCREEN W PCR REFLEX
C DIFFICILE (CDIFF) INTERP: NOT DETECTED
C Diff antigen: NEGATIVE
C Diff toxin: NEGATIVE

## 2016-08-30 ENCOUNTER — Ambulatory Visit
Admission: RE | Admit: 2016-08-30 | Discharge: 2016-08-30 | Disposition: A | Discharge status: Home / Self Care | Payer: Medicare Other | Source: Ambulatory Visit | Attending: Student | Admitting: Student

## 2016-08-30 DIAGNOSIS — R066 Hiccough: Secondary | ICD-10-CM | POA: Diagnosis not present

## 2016-08-30 DIAGNOSIS — K30 Functional dyspepsia: Secondary | ICD-10-CM | POA: Diagnosis present

## 2016-08-30 LAB — PANCREATIC ELASTASE, FECAL: Pancreatic Elastase-1, Stool: >500 ug Elast./g (ref >200)

## 2016-10-14 DIAGNOSIS — G8929 Other chronic pain: Secondary | ICD-10-CM | POA: Insufficient documentation

## 2016-10-14 DIAGNOSIS — G4733 Obstructive sleep apnea (adult) (pediatric): Secondary | ICD-10-CM | POA: Insufficient documentation

## 2016-10-14 DIAGNOSIS — M79642 Pain in left hand: Secondary | ICD-10-CM

## 2016-10-14 DIAGNOSIS — Z9989 Dependence on other enabling machines and devices: Secondary | ICD-10-CM

## 2016-11-10 ENCOUNTER — Other Ambulatory Visit: Payer: Self-pay | Admitting: Urology

## 2016-11-14 ENCOUNTER — Other Ambulatory Visit: Payer: Self-pay

## 2016-11-14 DIAGNOSIS — N401 Enlarged prostate with lower urinary tract symptoms: Secondary | ICD-10-CM

## 2016-11-15 ENCOUNTER — Other Ambulatory Visit: Payer: Medicare Other

## 2016-11-15 DIAGNOSIS — N401 Enlarged prostate with lower urinary tract symptoms: Secondary | ICD-10-CM

## 2016-11-16 LAB — PSA: PROSTATE SPECIFIC AG, SERUM: 0.8 ng/mL (ref 0.0–4.0)

## 2016-11-21 NOTE — Progress Notes (Signed)
9:21 AM   Brandon Maxwell 05-31-1945 [id number]  Referring provider: Glendon Axe, MD [facility] [place], [place] [address]  Chief Complaint  Patient presents with  . Benign Prostatic Hypertrophy    1 year follow up    HPI: Brandon Maxwell is 71 year old Caucasian male with a history of hematospermia and BPH with LUTS who presents today for a 6 month follow up.    BPH WITH LUTS: His IPSS score today is 13, which is moderate lower urinary tract symptomatology. He is mixed with his quality life due to his urinary symptoms. His previous IPSS score was 10/2.  His previous PVR is 0 mL.  His major complaint(s) today are/is frequency, urgency, nocturia and a weak urinary stream.  He has been having intermittent suprapubic pain x 2 months.  He denies any dysuria and hematuria.   He currently taking finasteride and tamsulosin.   He is drinking a lot of sodas.  His UA today is negative.  His has had a cystoscopic examination in 12/2012 by Brandon Maxwell and it was noted that the prostatic urethra demonstrated moderate bilobar prostatic hypertrophy, partial visual obstruction and prominent hypervascularity. Patient was placed on finasteride and was followed with PVR's.  He also denies any recent fevers, chills, nausea or vomiting.  He does not have a family history of PCa.      IPSS    Row Name 11/22/16 0800         International Prostate Symptom Score   How often have you had the sensation of not emptying your bladder? Less than 1 in 5     How often have you had to urinate less than every two hours? Almost always     How often have you found you stopped and started again several times when you urinated? Less than half the time     How often have you found it difficult to postpone urination? Less than 1 in 5 times     How often have you had a weak urinary stream? Less than half the time     How often have you had to strain to start urination? Less than 1 in 5 times     How many times did you typically get up at night to urinate? 1 Time     Total IPSS Score 13       Quality of Life due to urinary symptoms   If you were to spend the rest of your life with your urinary condition just the way it is now how would you feel about that? 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  When he reduced the dose of his ASA from 325 mg to 81 mg, hematospermia did abate.  He is not having pain or curvature with ejaculation.   PMH: Past Medical History:  Diagnosis Date  . Anemia   . BPH (benign prostatic hyperplasia)   . BPH (benign prostatic hyperplasia)   . Diabetes ([place])   . Hematospermia   . HLD (hyperlipidemia)   . Hypertension   . Obesity   . Prostatitis     Surgical History: Past Surgical History:  Procedure Laterality Date  . CORONARY ANGIOPLASTY WITH STENT PLACEMENT    . GLAUCOMA SURGERY    . rectal fissure repair      Home Medications:  Allergies as of 11/22/2016      Reactions   Sulfa Antibiotics  Other reaction(s): UNSPECIFIED      Medication List       Accurate as of 11/22/16  9:21 AM. Always use your most recent med list.          aspirin 81 MG tablet Take 81 mg by mouth daily.   carvedilol 3.125 MG tablet Commonly known as:  COREG Take 3.125 mg by mouth 2 (two) times daily with a meal.   clopidogrel 75 MG tablet Commonly known as:  PLAVIX Take 75 mg by mouth daily.   clopidogrel 75 MG tablet Commonly known as:  PLAVIX TAKE 1 TABLET BY MOUTH ONCE DAILY   COMBIGAN 0.2-0.5 % ophthalmic solution Generic drug:  brimonidine-timolol   diclofenac 50 MG EC tablet Commonly known as:  VOLTAREN Take by mouth.   dorzolamide 2 % ophthalmic solution Commonly known as:  TRUSOPT   doxycycline 100 MG capsule Commonly known as:  VIBRAMYCIN Take 1 capsule (100 mg total) by mouth every 12 (twelve) hours.   DULoxetine 30 MG capsule Commonly known as:  CYMBALTA Reported on 05/26/2015     ferrous gluconate 324 MG tablet Commonly known as:  FERGON Take 324 mg by mouth daily with breakfast.   FIFTY50 PEN NEEDLES 31G X 5 MM Misc Generic drug:  Insulin Pen Needle Use as directed.   finasteride 5 MG tablet Commonly known as:  PROSCAR TAKE 1 TABLET BY MOUTH  DAILY   finasteride 5 MG tablet Commonly known as:  PROSCAR Take 1 tablet (5 mg total) by mouth daily.   gabapentin 400 MG capsule Commonly known as:  NEURONTIN   glipiZIDE 5 MG 24 hr tablet Commonly known as:  GLUCOTROL XL   hydrochlorothiazide 12.5 MG capsule Commonly known as:  MICROZIDE TAKE ONE TABLET BY MOUTH EVERY DAY   insulin glargine 100 UNIT/ML injection Commonly known as:  LANTUS Inject 40 Units into the skin.   ipratropium 0.03 % nasal spray Commonly known as:  ATROVENT   latanoprost 0.005 % ophthalmic solution Commonly known as:  XALATAN Apply to eye.   levocetirizine 5 MG tablet Commonly known as:  XYZAL Take by mouth.   levocetirizine 5 MG tablet Commonly known as:  XYZAL TAKE 1 TABLET BY MOUTH  EVERY EVENING   lovastatin 40 MG tablet Commonly known as:  MEVACOR Take 40 mg by mouth at bedtime.   magnesium oxide 400 MG tablet Commonly known as:  MAG-OX Take by mouth.   meloxicam 7.5 MG tablet Commonly known as:  MOBIC   metFORMIN 1000 MG tablet Commonly known as:  GLUCOPHAGE Take 1,000 mg by mouth 2 (two) times daily with a meal. Reported on 05/26/2015   mometasone 0.1 % lotion Commonly known as:  ELOCON Reported on 05/26/2015   MULTI-VITAMINS Tabs Take by mouth.   MULTI-VITAMINS Tabs Take by mouth.   pantoprazole 40 MG tablet Commonly known as:  PROTONIX Take by mouth.   pantoprazole 40 MG tablet Commonly known as:  PROTONIX TAKE ONE TABLET BY MOUTH EVERY DAY   pioglitazone 45 MG tablet Commonly known as:  ACTOS Take 45 mg by mouth daily.   tamsulosin 0.4 MG Caps capsule Commonly known as:  FLOMAX Take 1 capsule (0.4 mg total) by mouth daily.    tamsulosin 0.4 MG Caps capsule Commonly known as:  FLOMAX Take 1 capsule (0.4 mg total) by mouth daily.   TRULICITY [place] Inject into the skin.   TRULICITY 3.54 SF/6.8LE Sopn Generic drug:  Dulaglutide   valsartan 80 MG tablet Commonly known as:  DIOVAN Take 80 mg by mouth daily.            Discharge Care Instructions        Start     Ordered   11/22/16 0000  BLADDER SCAN AMB NON-IMAGING     11/22/16 0847   11/22/16 0000  Urinalysis, Complete     11/22/16 0903   11/22/16 0000  tamsulosin (FLOMAX) 0.4 MG CAPS capsule  Daily    Question:  Supervising Provider  Answer:  Hollice Espy   11/22/16 6269   11/22/16 0000  finasteride (PROSCAR) 5 MG tablet  Daily    Question:  Supervising Provider  Answer:  Hollice Espy   11/22/16 4854   11/22/16 0000  CT ABDOMEN PELVIS WO CONTRAST    Question Answer Comment  Preferred imaging location? [facility]   Radiology Contrast Protocol - do NOT remove file path [email or web address]      11/22/16 0920      Allergies:  Allergies  Allergen Reactions  . Sulfa Antibiotics     Other reaction(s): UNSPECIFIED    Family History: Family History  Problem Relation Age of Onset  . Coronary artery disease Father   . Coronary artery disease Brother   . Nephrolithiasis Brother   . Kidney disease Neg Hx   . Prostate cancer Neg Hx   . Kidney cancer Neg Hx   . Bladder Cancer Neg Hx     Social History:  reports that he has never smoked. He has never used smokeless tobacco. He reports that he does not drink alcohol or use drugs.  ROS: UROLOGY Frequent Urination?: Yes Hard to postpone urination?: Yes Burning/pain with urination?: No Get up at night to urinate?: Yes Leakage of urine?: No Urine stream starts and stops?: No Trouble starting stream?: No Do you have to strain to urinate?: No Blood in urine?: No Urinary tract infection?: No Sexually transmitted disease?: No Injury to kidneys or  bladder?: No Painful intercourse?: No Weak stream?: Yes Erection problems?: Yes Penile pain?: No  Gastrointestinal Nausea?: No Vomiting?: No Indigestion/heartburn?: No Diarrhea?: No Constipation?: No  Constitutional Fever: No Night sweats?: No Weight loss?: No Fatigue?: No  Skin Skin rash/lesions?: No Itching?: No  Eyes Blurred vision?: No Double vision?: No  Ears/Nose/Throat Sore throat?: No Sinus problems?: No  Hematologic/Lymphatic Swollen glands?: No Easy bruising?: Yes  Cardiovascular Leg swelling?: No Chest pain?: No  Respiratory Cough?: No Shortness of breath?: No  Endocrine Excessive thirst?: No  Musculoskeletal Back pain?: Yes Joint pain?: No  Neurological Headaches?: No Dizziness?: No  Psychologic Depression?: No Anxiety?: No  Physical Exam: BP 131/75   Pulse 66   Ht 5\' 8"  (1.727 m)   Wt 204 lb 6.4 oz (92.7 kg)   BMI 31.08 kg/m   GU: Patient with uncircumcised phallus.  Foreskin easily retracted. Urethral meatus is patent.  No penile discharge. No penile lesions or rashes. Scrotum without lesions, cysts, rashes and/or edema.  Testicles are located scrotally bilaterally. No masses are appreciated in the testicles. Left and right epididymis are normal. Rectal: Patient with  normal sphincter tone. Perineum without scarring or rashes. No rectal masses are appreciated. Prostate is approximately 45 grams, no nodules are appreciated. Seminal vesicles are normal.   Laboratory Data: PSA   1.2 ng/mL on 05/25/2014            0.5 ng/mL on 11/23/2014            0.8 ng/mL on 11/19/2015  0.8 ng/mL on 11/15/2016  Urinalysis Unremarkable. See EPIC.  Pertinent Imaging Results for orders placed or performed in visit on 11/22/16  BLADDER SCAN AMB NON-IMAGING  Result Value Ref Range   Scan Result 0    I have reviewed the labs  Assessment & Plan:    1. BPH (benign prostatic hyperplasia) with LUTS:   Patient's IPSS score is 13/3.   It is slightly worse.  He will continue the finasteride and tamsulosin.  Refills given.  He will follow up in 12 months for a PSA, exam and an IPSS score.    2. Hematospermia:    Resolved.   3. Suprapubic pain  - UA is negative  - Patient encouraged to eliminate soda from his diet as it is a bladder irritant  - schedule CT scan for further evaluation - I will call with results    Return in about 1 year (around 11/22/2017) for IPSS, PSA and exam.  Zara Council, [facility]  [place] [address], [place], [place] [address] [phone]

## 2016-11-22 ENCOUNTER — Ambulatory Visit (INDEPENDENT_AMBULATORY_CARE_PROVIDER_SITE_OTHER): Payer: Medicare Other | Admitting: Urology

## 2016-11-22 ENCOUNTER — Encounter: Payer: Self-pay | Admitting: Urology

## 2016-11-22 VITALS — BP 131/75 | HR 66 | Ht 68.0 in | Wt 204.4 lb

## 2016-11-22 DIAGNOSIS — N138 Other obstructive and reflux uropathy: Secondary | ICD-10-CM | POA: Diagnosis not present

## 2016-11-22 DIAGNOSIS — N401 Enlarged prostate with lower urinary tract symptoms: Secondary | ICD-10-CM | POA: Diagnosis not present

## 2016-11-22 DIAGNOSIS — R102 Pelvic and perineal pain: Secondary | ICD-10-CM

## 2016-11-22 DIAGNOSIS — R109 Unspecified abdominal pain: Secondary | ICD-10-CM

## 2016-11-22 LAB — URINALYSIS, COMPLETE
BILIRUBIN UA: NEGATIVE
Glucose, UA: NEGATIVE
KETONES UA: NEGATIVE
Leukocytes, UA: NEGATIVE
Nitrite, UA: NEGATIVE
PH UA: 7.5 (ref 5.0–7.5)
RBC, UA: NEGATIVE
SPEC GRAV UA: 1.02 (ref 1.005–1.030)
UUROB: 0.2 mg/dL (ref 0.2–1.0)

## 2016-11-22 LAB — BLADDER SCAN AMB NON-IMAGING: SCAN RESULT: 0

## 2016-11-22 MED ORDER — TAMSULOSIN HCL 0.4 MG PO CAPS: 0.4000 mg | ORAL_CAPSULE | Freq: Every day | ORAL | 6 refills | Status: DC

## 2016-11-22 MED ORDER — FINASTERIDE 5 MG PO TABS: 5.0000 mg | ORAL_TABLET | Freq: Every day | ORAL | 3 refills | Status: DC

## 2016-12-06 ENCOUNTER — Ambulatory Visit
Admission: RE | Admit: 2016-12-06 | Discharge: 2016-12-06 | Disposition: A | Discharge status: Home / Self Care | Payer: Medicare Other | Source: Ambulatory Visit | Attending: Urology | Admitting: Urology

## 2016-12-06 DIAGNOSIS — K8020 Calculus of gallbladder without cholecystitis without obstruction: Secondary | ICD-10-CM | POA: Insufficient documentation

## 2016-12-06 DIAGNOSIS — K5730 Diverticulosis of large intestine without perforation or abscess without bleeding: Secondary | ICD-10-CM | POA: Diagnosis not present

## 2016-12-06 DIAGNOSIS — R109 Unspecified abdominal pain: Secondary | ICD-10-CM

## 2016-12-13 ENCOUNTER — Telehealth: Payer: Self-pay | Admitting: Urology

## 2016-12-13 DIAGNOSIS — R102 Pelvic and perineal pain: Secondary | ICD-10-CM

## 2016-12-13 NOTE — Telephone Encounter (Signed)
Please advise 

## 2016-12-13 NOTE — Telephone Encounter (Signed)
Patient notified and in agreement for PT referral  Order for PT placed

## 2016-12-13 NOTE — Telephone Encounter (Signed)
Patient's CT did not demonstrate any kidney stones or any findings that would explain his urinary symptoms.  He does have gall stones.  I would recommend PT for his suprapubic pain at this time.

## 2016-12-13 NOTE — Telephone Encounter (Signed)
Pt called asking about his CT results.  Please give pt a call [phone].

## 2017-01-09 ENCOUNTER — Ambulatory Visit: Payer: Medicare Other | Attending: Urology | Admitting: Physical Therapy

## 2017-01-09 DIAGNOSIS — M6281 Muscle weakness (generalized): Secondary | ICD-10-CM | POA: Insufficient documentation

## 2017-01-09 DIAGNOSIS — M791 Myalgia, unspecified site: Secondary | ICD-10-CM | POA: Diagnosis not present

## 2017-01-09 DIAGNOSIS — M6208 Separation of muscle (nontraumatic), other site: Secondary | ICD-10-CM

## 2017-01-09 NOTE — Patient Instructions (Addendum)
    Deep core level 2

## 2017-01-10 NOTE — Therapy (Signed)
[place] MAIN [facility] SERVICES [address] [place], [place], [address] Phone: [phone]   Fax:  [phone]  Physical Therapy Evaluation  Patient Details  Name: Brandon Maxwell MRN: [id number] Date of Birth: 14-Apr-1945 No Data Recorded  Encounter Date: 01/09/2017    Past Medical History:  Diagnosis Date  . Anemia   . BPH (benign prostatic hyperplasia)   . BPH (benign prostatic hyperplasia)   . Diabetes ([place])   . Hematospermia   . HLD (hyperlipidemia)   . Hypertension   . Obesity   . Prostatitis     Past Surgical History:  Procedure Laterality Date  . CORONARY ANGIOPLASTY WITH STENT PLACEMENT    . GLAUCOMA SURGERY    . rectal fissure repair      There were no vitals filed for this visit.       Subjective Assessment - 01/10/17 2217    Subjective Pt reported pain located in the middle of pubic bone 3 months ago. The pain comes on when sitting and it comes and goes.  Pt has no pain curerntly.  A CAT scan was done and nothing showed up except gallstones. The gallstones have not cleared. The pain level 2/10 and has not changed.  Pt reports sometimes there is a burning with urination once in a while before the pubic bone pain.   Pt states his semen is a dark color for several years after he bled while wearing a catheter during an overnight stay at the hospital for a stent placement in his heart 7 years ago.   Pt's urologist is aware of this.  Denied pain with ejaculation, erection, nor urination.    Pt's bowel movements are hardly ever solid.  Pt has had loose stools for a couple of years. [place] Stool Type 7.  Daily fluid intake: soft drinks 1-2  ( 12 fl oz) cans, 2 - 12 fl oz water.   Pt had anal fissures with surgery 15 years ago. Since that surgery, pt has has blood when he wipes on in his underwear < 25 % of the time.     Pertinent History Hx of herniated disk in back, anal fissures with surgery   Patient Stated Goals Pt would  like to get rid of gall stones but is not sure what PT can do for it.              [facility] PT Assessment - 01/10/17 2147      Assessment   Medical Diagnosis (P)  suprapubic pain   Referring Provider (P)  Zara Council     Precautions   Precautions (P)  None     Restrictions   Weight Bearing Restrictions (P)  No     Balance Screen   Has the patient fallen in the past 6 months (P)  No     Observation/Other Assessments   Observations (P)  ankles crossed     Coordination   Gross Motor Movements are Fluid and Coordinated (P)  --  diaphragmatic excursion limited   Fine Motor Movements are Fluid and Coordinated (P)  --  abdominal / pelvic floor straining            Objective measurements completed on examination: See above findings.        Pelvic Floor Special Questions - 01/10/17 2146    Diastasis Recti 3 fingers width along linea alba ( abdominal bulging with head lift     Pelvic Floor Internal Exam pt was explained  the details of the exam and pt consented verbally without ocntraindications    Exam Type Rectal   Palpation increased tensions . tenderness at R side of puborectalismm ( post Tx: decreased) . poor lumbopelvic rhythm for anterior tilt            [facility] Adult PT Treatment/Exercise - 01/10/17 2146      Therapeutic Activites    Therapeutic Activities --  see pt instructions      Neuro Re-ed    Neuro Re-ed Details  see pt instructions      Manual Therapy   Internal Pelvic Floor STM with coordination breathing and pelvic tilts along R puborectalis mm                      PT Long Term Goals - 01/09/17 1412      PT LONG TERM GOAL #1   Title Pt will report decreased days when the pubic bone pain comes on from 2-3 days/ week to < 2 days in order to minimize pubic pain and be able to sit with more confort   Time 12   Period Weeks   Status New     PT LONG TERM GOAL #2   Title Pt will demo no R sided pelvic floor (puborectalis mm  tensions) in order to improve GI and bowel function     Time 12   Period Weeks   Status New     PT LONG TERM GOAL #3   Title Pt will demo decreasded fingers separation of 3 fingers to < 1 fingers width along linea alba in order to improve postural stability, minimize supinc pain, and improve motility   Time 12   Period Weeks   Status New     PT LONG TERM GOAL #4   Title Pt will decrease his Jackson score from 21% to < 16% in order to improve QOL   Time 12   Period Weeks   Status New     PT LONG TERM GOAL #5   Title Pt will decrease his COREFO score from 20% to < 15% in order to restore bowel function and less diarhea   Time 12   Period Weeks   Status New     Additional Long Term Goals   Additional Long Term Goals Yes     PT LONG TERM GOAL #6   Title Pt will report improved stool consistency from Type 7 to 4-5 [facility] Stool Type) in order to minimize excessive wiping   Time 12   Period Weeks   Status New                Plan - 01/10/17 2139    Clinical Impression Statement Pt is 71 yo male who reports suprapubic pain, loose stools, frequent wiping of anus with bleeding, and dark colored semen.  These impact his QOL. Pt's clincial presentation showed abdominal separation, dyscoordination of pelvic floor mm with overuse of abdominal mm straining, and overactive pelvic floor mm. Suspect pt's tight pelvic floor mm may be associated with pt's Hx of surgerical repair of anal fissues   Suspect possibility of straining to be associated with dark colored semen because pt reported this condition had started after he was catheterized for a cardiac procedure years ago. Pt states urologist has been made aware of this condition and PT plans to f/u with urologist and pt.  Pt has undergone tests for his abdominal pain which showed presence of gall stones and  he is interested in understanding how to address his gallstones. Pt was educated to consult his MD or a nutritionist because this issue is  outside of the scope of PT.     From a musculoskeletal perspective, pt's sx made be due to a poor intra-abdominal pressure system and dyscoordination of deep core mm. Today, pt addressed his diastasis recti, limited diaphragmatic excursion, and tight pelvic floor mm. Following Tx which included manual Tx over abdominal mm and intrarectal Tx, pt demo'd improved abdominal closure / Co-activation of TrA with diaprhagm and pelvic floor, and less pelvic floor mm tensions. Pt had no complaints during and after Tx.     History and Personal Factors relevant to plan of care: gallstones, anal fissures and surgeries, co-morbidities   Clinical Presentation Evolving   Rehab Potential Good   PT Frequency 1x / week   PT Duration 12 weeks   PT Treatment/Interventions Patient/family education;Therapeutic activities;Therapeutic exercise;Stair training;Moist Heat;Neuromuscular re-education;Manual lymph drainage;Manual techniques;Scar mobilization;Energy conservation;Traction;Gait training;Functional mobility training   Consulted and Agree with Plan of Care Patient      Patient will benefit from skilled therapeutic intervention in order to improve the following deficits and impairments:  Increased fascial restricitons, Postural dysfunction, Pain, Improper body mechanics, Decreased scar mobility, Decreased endurance, Decreased coordination, Decreased range of motion, Decreased mobility, Increased muscle spasms, Impaired sensation, Decreased activity tolerance, Decreased strength  Visit Diagnosis: Myalgia  Muscle weakness (generalized)  Diastasis recti      G-Codes - 2017-01-14 1431    Functional Assessment Tool Used (Outpatient Only) clinjcal judgement, PDI and COREFO     Functional Limitation Self care   Self Care Current Status (O9629) At least 20 percent but less than 40 percent impaired, limited or restricted   Self Care Goal Status (B2841) At least 1 percent but less than 20 percent impaired, limited or  restricted       Problem List Patient Active Problem List   Diagnosis Date Noted  . Pure hypercholesterolemia 10/08/2015  . Dizziness 07/30/2015  . Perennial allergic rhinitis 04/20/2015  . Dysuria 12/20/2014  . BPH with obstruction/lower urinary tract symptoms 11/23/2014  . Hematospermia 11/23/2014  . Type 2 diabetes mellitus without complications ([place]) 32/44/0102  . Hypomagnesemia 07/01/2014  . Combined fat and carbohydrate induced hyperlipemia 07/01/2014  . Anemia, unspecified 03/25/2014  . Essential hypertension 12/23/2013  . Atherosclerotic heart disease of native coronary artery without angina pectoris 10/27/2013  . HLD (hyperlipidemia) 09/23/2013  . Benign prostatic hyperplasia with urinary obstruction 12/21/2011  . Chronic prostatitis 12/21/2011  . Disorder of male genital organs 12/21/2011  . Incomplete bladder emptying 12/21/2011  . Retention of urine 12/21/2011    Jerl Mina ,PT, DPT, E-RYT  01/10/2017, 10:20 PM  [place] MAIN [facility] SERVICES [address] [place], [place], [address] Phone: [phone]   Fax:  [phone]  Name: Brandon Maxwell MRN: [id number] Date of Birth: [birth year]

## 2017-01-23 ENCOUNTER — Ambulatory Visit: Payer: Medicare Other | Admitting: Physical Therapy

## 2017-01-23 DIAGNOSIS — M6208 Separation of muscle (nontraumatic), other site: Secondary | ICD-10-CM

## 2017-01-23 DIAGNOSIS — M791 Myalgia, unspecified site: Secondary | ICD-10-CM

## 2017-01-23 DIAGNOSIS — M6281 Muscle weakness (generalized): Secondary | ICD-10-CM

## 2017-01-23 NOTE — Patient Instructions (Addendum)
TO LOOSEN UP MIDBACK AND SHOULDERS   Open book ( pillow between knees and behind back ) 3/4 turn   arm swings   Cross arms and "taking off sweater" motion   shoudler roll backwards     TO STRENGTHEN ABDOMINAL MUSCLES  "Pulling seat belt"  In chair   Band over door knob with knot on the other side of the door Stand perpendicular to the door,  Exhale to pull band from R ear height across body to the L pocket without turning pelvis and knees  Follow hand with eyes/head  10 x 2 reps      Deep core with knee out

## 2017-01-24 NOTE — Therapy (Signed)
[place] MAIN [facility] SERVICES [address] [place], [place], [address] Phone: [phone]   Fax:  [phone]  Physical Therapy Treatment  Patient Details  Name: Brandon Maxwell MRN: [id number] Date of Birth: November 04, 1945 No Data Recorded  Encounter Date: 01/23/2017      PT End of Session - 01/23/17 0917    Visit Number 2   Number of Visits 12   Authorization Type g code    PT Start Time 0803   PT Stop Time 0856   PT Time Calculation (min) 53 min   Activity Tolerance Patient tolerated treatment well;No increased pain      Past Medical History:  Diagnosis Date  . Anemia   . BPH (benign prostatic hyperplasia)   . BPH (benign prostatic hyperplasia)   . Diabetes ([place])   . Hematospermia   . HLD (hyperlipidemia)   . Hypertension   . Obesity   . Prostatitis     Past Surgical History:  Procedure Laterality Date  . CORONARY ANGIOPLASTY WITH STENT PLACEMENT    . GLAUCOMA SURGERY    . rectal fissure repair      There were no vitals filed for this visit.      Subjective Assessment - 01/23/17 0808    Subjective Pt reported no changes to his pubic pain.    Pertinent History Hx of herniated disk in back, anal fissures with surgery   Patient Stated Goals Pt would like to get rid of gall stones but is not sure what PT can do for it.              [facility] PT Assessment - 01/23/17 0918      Observation/Other Assessments   Observations lumbar lordosis with PNF D2 ext- withheld exercise .  Added D1 Felxion instead seated to promote oblique activitation      Palpation   Spinal mobility increased thoracic mm tightness along T3-12 B with limited scapular mobility                  Pelvic Floor Special Questions - 01/23/17 0919    Diastasis Recti 3 fingers below sternum ( no fingers width below umbilicus compared to last visit,( less abdominal bulging with head crunch post Tx)            [facility] Adult PT Treatment/Exercise  - 01/23/17 0920      Neuro Re-ed    Neuro Re-ed Details  see pt instructions      Exercises   Exercises --  see pt isntructions     Manual Therapy   Manual therapy comments STM/ MWM along paraspinals of thoracic,  fascial releases with gentle pressure over distal sternum/ intercostals to faciliate lateral excursion and depresison of ribcage                 PT Education - 01/23/17 0911    Education provided Yes   Education Details HEP   Person(s) Educated Patient   Methods Explanation;Demonstration;Tactile cues;Handout;Verbal cues   Comprehension Returned demonstration;Verbalized understanding             PT Long Term Goals - 01/09/17 1412      PT LONG TERM GOAL #1   Title Pt will report decreased days when the pubic bone pain comes on from 2-3 days/ week to < 2 days in order to minimize pubic pain and be able to sit with more confort   Time 12   Period Weeks   Status New  PT LONG TERM GOAL #2   Title Pt will demo no R sided pelvic floor (puborectalis mm tensions) in order to improve GI and bowel function     Time 12   Period Weeks   Status New     PT LONG TERM GOAL #3   Title Pt will demo decreasded fingers separation of 3 fingers to < 1 fingers width along linea alba in order to improve postural stability, minimize supinc pain, and improve motility   Time 12   Period Weeks   Status New     PT LONG TERM GOAL #4   Title Pt will decrease his [place] score from 21% to < 16% in order to improve QOL   Time 12   Period Weeks   Status New     PT LONG TERM GOAL #5   Title Pt will decrease his COREFO score from 20% to < 15% in order to restore bowel function and less diarhea   Time 12   Period Weeks   Status New     Additional Long Term Goals   Additional Long Term Goals Yes     PT LONG TERM GOAL #6   Title Pt will report improved stool consistency from Type 7 to 4-5 [facility] Stool Type) in order to minimize excessive wiping   Time 12   Period Weeks    Status New               Plan - 01/23/17 1005    Clinical Impression Statement Pt demo'd inmproved diastasis recti compared to his first visit but required manual Tx to address upper abdominal separation today. Pt demo'd increased thoracic mobility with increased diaphragmatic excursion and depression for better abdominal co-activation with deep core mm.  Added oblique strengthening to also improved intraabdominal pressure system. Pt continues to benefit from skilled PT.   Rehab Potential Good   PT Frequency 1x / week   PT Duration 12 weeks   PT Treatment/Interventions Patient/family education;Therapeutic activities;Therapeutic exercise;Stair training;Moist Heat;Neuromuscular re-education;Manual lymph drainage;Manual techniques;Scar mobilization;Energy conservation;Traction;Gait training;Functional mobility training   Consulted and Agree with Plan of Care Patient      Patient will benefit from skilled therapeutic intervention in order to improve the following deficits and impairments:  Increased fascial restricitons, Postural dysfunction, Pain, Improper body mechanics, Decreased scar mobility, Decreased endurance, Decreased coordination, Decreased range of motion, Decreased mobility, Increased muscle spasms, Impaired sensation, Decreased activity tolerance, Decreased strength  Visit Diagnosis: Muscle weakness (generalized)  Diastasis recti  Myalgia     Problem List Patient Active Problem List   Diagnosis Date Noted  . Pure hypercholesterolemia 10/08/2015  . Dizziness 07/30/2015  . Perennial allergic rhinitis 04/20/2015  . Dysuria 12/20/2014  . BPH with obstruction/lower urinary tract symptoms 11/23/2014  . Hematospermia 11/23/2014  . Type 2 diabetes mellitus without complications ([place]) 16/96/7893  . Hypomagnesemia 07/01/2014  . Combined fat and carbohydrate induced hyperlipemia 07/01/2014  . Anemia, unspecified 03/25/2014  . Essential hypertension 12/23/2013  .  Atherosclerotic heart disease of native coronary artery without angina pectoris 10/27/2013  . HLD (hyperlipidemia) 09/23/2013  . Benign prostatic hyperplasia with urinary obstruction 12/21/2011  . Chronic prostatitis 12/21/2011  . Disorder of male genital organs 12/21/2011  . Incomplete bladder emptying 12/21/2011  . Retention of urine 12/21/2011    Jerl Mina  ,PT, DPT, E-RYT  01/24/2017, 1:58 PM  [place] MAIN [facility] SERVICES [address] [place], [place], [address] Phone: [phone]   Fax:  [phone]  Name: Brandon Maxwell MRN: [id number] Date of Birth: July 11, 1945

## 2017-01-31 ENCOUNTER — Ambulatory Visit: Payer: Medicare Other | Admitting: Physical Therapy

## 2017-01-31 DIAGNOSIS — M791 Myalgia, unspecified site: Secondary | ICD-10-CM | POA: Diagnosis not present

## 2017-01-31 DIAGNOSIS — M6281 Muscle weakness (generalized): Secondary | ICD-10-CM

## 2017-01-31 DIAGNOSIS — M6208 Separation of muscle (nontraumatic), other site: Secondary | ICD-10-CM

## 2017-01-31 NOTE — Patient Instructions (Addendum)
Deep core level 1 ( breathing , add quick pelvic floor squeeze. Avoid pushing with abdominal muscles)    Deep core level 2 ( make sure there is no wobbling of the pelvis, press strongly with opposite leg and hip when other moves)    ________  Stretches for pelvic floor: Laying on your back with a towel under opposite thigh of the thigh you want to stretch Cross __  ankle over __  Thigh  Inhale, feel pelvic floor lengthen/diaphragm expand left and right laterally Exhale, bring thighs closer to chest by pulling towel with hands. Keep shoulders and neck down on the pillow and relaxed.   5 breaths       Abdominal massage upward from L,  R , center to belly button 3 stroke x 3 , pressure is gentle and light with all fingers flat, not using finger tips

## 2017-01-31 NOTE — Therapy (Signed)
[place] MAIN [facility] SERVICES [address] [place], [place], [address] Phone: [phone]   Fax:  [phone]  Physical Therapy Treatment  Patient Details  Name: Brandon Maxwell MRN: [id number] Date of Birth: [birth year] No Data Recorded  Encounter Date: 01/31/2017      PT End of Session - 01/31/17 0848    Visit Number 3   Number of Visits 12   Authorization Type g code    PT Start Time 0803   PT Stop Time 5643   PT Time Calculation (min) 54 min   Activity Tolerance Patient tolerated treatment well;No increased pain      Past Medical History:  Diagnosis Date  . Anemia   . BPH (benign prostatic hyperplasia)   . BPH (benign prostatic hyperplasia)   . Diabetes ([place])   . Hematospermia   . HLD (hyperlipidemia)   . Hypertension   . Obesity   . Prostatitis     Past Surgical History:  Procedure Laterality Date  . CORONARY ANGIOPLASTY WITH STENT PLACEMENT    . GLAUCOMA SURGERY    . rectal fissure repair      There were no vitals filed for this visit.      Subjective Assessment - 01/31/17 0808    Subjective Pt reports his pubic pain left for three days after leaving last session. It came back at the same level of discomfort.  Pt feels he is moving around a little better.    Pertinent History Hx of herniated disk in back, anal fissures with surgery   Patient Stated Goals Pt would like to get rid of gall stones but is not sure what PT can do for it.              [facility] PT Assessment - 01/31/17 0846      Coordination   Gross Motor Movements are Fluid and Coordinated --  abdominal straining w/ cue for pelvic floor contraction      Palpation   Spinal mobility limited L rib excursion                  Pelvic Floor Special Questions - 01/31/17 0843    External Perineal Exam pt consented verbally    External Palpation increased tensions and tenderness at mons pubis, ischiocavernosus L > R  ( decreased post Tx)    Grade 2 contraction, initially w/ abdominal straining            [facility] Adult PT Treatment/Exercise - 01/31/17 0844      Neuro Re-ed    Neuro Re-ed Details  see pt instructions      Manual Therapy   Manual therapy comments STM at problem areas noted in assessment   fascial releases over L ribs/ sternum/ intercorstals                 PT Education - 01/31/17 0848    Education provided Yes   Education Details HEP   Person(s) Educated Patient   Methods Explanation;Demonstration;Tactile cues;Verbal cues;Handout   Comprehension Verbalized understanding;Returned demonstration             PT Long Term Goals - 01/31/17 0853      PT LONG TERM GOAL #1   Title Pt will report decreased days when the pubic bone pain comes on from 2-3 days/ week to < 2 days in order to minimize pubic pain and be able to sit with more confort   Time 12   Period Weeks  Status Achieved     PT LONG TERM GOAL #2   Title Pt will demo no R sided pelvic floor (puborectalis mm tensions) in order to improve GI and bowel function     Time 12   Period Weeks   Status Partially Met     PT LONG TERM GOAL #3   Title Pt will demo decreasded fingers separation of 3 fingers to < 2 fingers width along linea alba in order to improve postural stability, minimize supinc pain, and improve motility   Time 12   Period Weeks   Status Partially Met     PT LONG TERM GOAL #4   Title Pt will decrease his PDI score from 21% to < 16% in order to improve QOL   Time 12   Period Weeks   Status On-going     PT LONG TERM GOAL #5   Title Pt will decrease his COREFO score from 20% to < 15% in order to restore bowel function and less diarhea   Time 12   Period Weeks   Status On-going     PT LONG TERM GOAL #6   Title Pt will report improved stool consistency from Type 7 to 4-5 [facility] Stool Type) in order to minimize excessive wiping   Time 12   Period Weeks   Status Achieved               Plan -  01/31/17 0849    Clinical Impression Statement Pt has met 2/6 goals on this 3rd visit. Pt is having more solid forming stools instead of loose stools. Pt did not have pubic pain/discomfort for 3 days after last session. Pt continues to demo improved intraabdominal pressure system with increased diaphragmatic excursion and stronger deep core mm and less abdominal bulging/ diastasis recti.  Today, pt demo'd decreased  tenderness/ tensions at supra pubic area , ischiocavernosus, and deep transverse perineal mm following external manual Tx. Pt demo'd correct pelvic floor contraction without straining of abdominal mm. Suspect pt will continue to progress well with today's Tx and it is likely that pt's rectus abdominus ( distal attachment) and anterior pelvic floor mm are involved in his Sx. Plan to continue to increase mobility of these tissues while improving his intraabdominal pressure system/deep core coordination and strength. Pt continues to benefit from skilled PT.    Rehab Potential Good   PT Frequency 1x / week   PT Duration 12 weeks   PT Treatment/Interventions Patient/family education;Therapeutic activities;Therapeutic exercise;Stair training;Moist Heat;Neuromuscular re-education;Manual lymph drainage;Manual techniques;Scar mobilization;Energy conservation;Traction;Gait training;Functional mobility training   Consulted and Agree with Plan of Care Patient      Patient will benefit from skilled therapeutic intervention in order to improve the following deficits and impairments:  Increased fascial restricitons, Postural dysfunction, Pain, Improper body mechanics, Decreased scar mobility, Decreased endurance, Decreased coordination, Decreased range of motion, Decreased mobility, Increased muscle spasms, Impaired sensation, Decreased activity tolerance, Decreased strength  Visit Diagnosis: Myalgia     Problem List Patient Active Problem List   Diagnosis Date Noted  . Pure hypercholesterolemia  10/08/2015  . Dizziness 07/30/2015  . Perennial allergic rhinitis 04/20/2015  . Dysuria 12/20/2014  . BPH with obstruction/lower urinary tract symptoms 11/23/2014  . Hematospermia 11/23/2014  . Type 2 diabetes mellitus without complications ([facility]) 10/02/2014  . Hypomagnesemia 07/01/2014  . Combined fat and carbohydrate induced hyperlipemia 07/01/2014  . Anemia, unspecified 03/25/2014  . Essential hypertension 12/23/2013  . Atherosclerotic heart disease of native coronary artery without angina pectoris  10/27/2013  . HLD (hyperlipidemia) 09/23/2013  . Benign prostatic hyperplasia with urinary obstruction 12/21/2011  . Chronic prostatitis 12/21/2011  . Disorder of male genital organs 12/21/2011  . Incomplete bladder emptying 12/21/2011  . Retention of urine 12/21/2011    Jerl Mina ,PT, DPT, E-RYT  01/31/2017, 8:56 AM  [place] MAIN [facility] SERVICES [address] [place], [place], [address] Phone: [phone]   Fax:  [phone]  Name: Brandon Maxwell MRN: [id number] Date of Birth: [birth year]

## 2017-02-06 ENCOUNTER — Ambulatory Visit: Payer: Medicare Other | Attending: Urology | Admitting: Physical Therapy

## 2017-02-06 DIAGNOSIS — M6208 Separation of muscle (nontraumatic), other site: Secondary | ICD-10-CM | POA: Diagnosis present

## 2017-02-06 DIAGNOSIS — R278 Other lack of coordination: Secondary | ICD-10-CM | POA: Diagnosis not present

## 2017-02-06 DIAGNOSIS — M6281 Muscle weakness (generalized): Secondary | ICD-10-CM | POA: Insufficient documentation

## 2017-02-06 DIAGNOSIS — M791 Myalgia, unspecified site: Secondary | ICD-10-CM | POA: Insufficient documentation

## 2017-02-06 NOTE — Patient Instructions (Signed)
Seated :  Shoulder retraction squeezes 3 sec, 10 reps   X 3 days

## 2017-02-06 NOTE — Therapy (Addendum)
[place] MAIN [facility] SERVICES [address] [place], [place], [address] Phone: [phone]   Fax:  [phone]  Physical Therapy Treatment  Patient Details  Name: Brandon Maxwell MRN: [id number] Date of Birth: 11-22-1945 No Data Recorded  Encounter Date: 02/06/2017  PT End of Session - 02/06/17 0820    Visit Number  4    Number of Visits  12    Authorization Type  g code     PT Start Time  0803    PT Stop Time  0821    PT Time Calculation (min)  18 min    Activity Tolerance  Patient tolerated treatment well;No increased pain       Past Medical History:  Diagnosis Date  . Anemia   . BPH (benign prostatic hyperplasia)   . BPH (benign prostatic hyperplasia)   . Diabetes ([place])   . Hematospermia   . HLD (hyperlipidemia)   . Hypertension   . Obesity   . Prostatitis     Past Surgical History:  Procedure Laterality Date  . CORONARY ANGIOPLASTY WITH STENT PLACEMENT    . GLAUCOMA SURGERY    . rectal fissure repair      There were no vitals filed for this visit.  Subjective Assessment - 02/06/17 0819    Subjective  Pt arrived to PT complaining of getting a "charlie horse" L calf 3 days ago while walking on the track. It has persisted for the past 3 days despite stretching. It hurts when walking on it. Pt reports his suprapubic pain has not bothered him him this past week since last session                 Pertinent History  Hx of herniated disk in back, anal fissures with surgery    Patient Stated Goals  Pt would like to get rid of gall stones but is not sure what PT can do for it.                 Assessment: L calf tightness compared to R, Tenderness reported with squeeze at medial level of calf.  Gait slightly antalgic      Treatment: Progression  To promote upright spine/ less forward head position  with retraction of scapular squeezes 3 sec, x 10 in seated position            PT Education - 02/06/17 0820     Education provided  Yes    Education Details  HEP    Person(s) Educated  Patient    Comprehension  Verbalized understanding;Returned demonstration;Verbal cues required;Tactile cues required          PT Long Term Goals - 02/06/17 0821      PT LONG TERM GOAL #1   Title  Pt will report decreased days when the pubic bone pain comes on from 2-3 days/ week to < 2 days in order to minimize pubic pain and be able to sit with more confort    Time  12    Period  Weeks    Status  Achieved      PT LONG TERM GOAL #2   Title  Pt will demo no R sided pelvic floor (puborectalis mm tensions) in order to improve GI and bowel function      Time  12    Period  Weeks    Status  Partially Met      PT LONG TERM GOAL #3   Title  Pt will demo decreasded fingers separation of 3 fingers to < 2 fingers width along linea alba in order to improve postural stability, minimize supinc pain, and improve motility    Time  12    Period  Weeks    Status  Partially Met      PT LONG TERM GOAL #4   Title  Pt will decrease his [place] score from 21% to < 16% in order to improve QOL    Time  12    Period  Weeks    Status  On-going      PT LONG TERM GOAL #5   Title  Pt will decrease his COREFO score from 20% to < 15% in order to restore bowel function and less diarhea    Time  12    Period  Weeks    Status  On-going      PT LONG TERM GOAL #6   Title  Pt will report improved stool consistency from Type 7 to 4-5 [facility] Stool Type) in order to minimize excessive wiping    Time  12    Period  Weeks    Status  Achieved            Plan - 02/06/17 9417    Clinical Impression Statement Pt is making progress with report of no pain after last session which focused on releasing pelvic floor muscles. Pt continues to show improved diastasis recti w/ stronger deep core mm. Progressed pt to seated shoulder retraction squeezes to promote less forward head posture and thoracic kyphosis. Additional Tx was withheld due to  the reason below.  Pt continues to benefit from skilled PT. Plan to reassess goals at next session.  Today's visit was abbreviated because pt complained of getting a "charlie horse" L calf 3 days ago while walking on the track. It came on suddenly and has persisted for the past 3 days despite stretching. It hurts when walking on it. Upon palpation by PT, L calf had increased tightness compared to R and pt reported tenderness when medial calf was squeezed. PT informed staff at pt's PCP's clinic re: Sx and requested that pt get assessed and r/o DVT. Pt was scheduled for an appt this morning with a PA 9:30am. Pt was escorted in wheelchair by PT to clinic.    PT plans to f/u on pt's case and modify POC as needed or recommended by PCP.               Rehab Potential  Good    PT Frequency  1x / week    PT Duration  12 weeks    PT Treatment/Interventions  Patient/family education;Therapeutic activities;Therapeutic exercise;Stair training;Moist Heat;Neuromuscular re-education;Manual lymph drainage;Manual techniques;Scar mobilization;Energy conservation;Traction;Gait training;Functional mobility training    Consulted and Agree with Plan of Care  Patient       Patient will benefit from skilled therapeutic intervention in order to improve the following deficits and impairments:  Increased fascial restricitons, Postural dysfunction, Pain, Improper body mechanics, Decreased scar mobility, Decreased endurance, Decreased coordination, Decreased range of motion, Decreased mobility, Increased muscle spasms, Impaired sensation, Decreased activity tolerance, Decreased strength  Visit Diagnosis: Myalgia  Muscle weakness (generalized)  Diastasis recti     Problem List Patient Active Problem List   Diagnosis Date Noted  . Pure hypercholesterolemia 10/08/2015  . Dizziness 07/30/2015  . Perennial allergic rhinitis 04/20/2015  . Dysuria 12/20/2014  . BPH with obstruction/lower urinary tract symptoms  11/23/2014  . Hematospermia 11/23/2014  . Type  2 diabetes mellitus without complications ([place]) 01/28/9021  . Hypomagnesemia 07/01/2014  . Combined fat and carbohydrate induced hyperlipemia 07/01/2014  . Anemia, unspecified 03/25/2014  . Essential hypertension 12/23/2013  . Atherosclerotic heart disease of native coronary artery without angina pectoris 10/27/2013  . HLD (hyperlipidemia) 09/23/2013  . Benign prostatic hyperplasia with urinary obstruction 12/21/2011  . Chronic prostatitis 12/21/2011  . Disorder of male genital organs 12/21/2011  . Incomplete bladder emptying 12/21/2011  . Retention of urine 12/21/2011    Jerl Mina ,PT, DPT, E-RYT  02/06/2017, 8:22 AM  [place] [facility] MAIN [facility] SERVICES [address] [place], [place], [address] Phone: [phone]   Fax:  [phone]  Name: DAYON WITT MRN: [id number] Date of Birth: 1946-02-10

## 2017-02-06 NOTE — Therapy (Deleted)
[place] MAIN [facility] SERVICES [address] [place], [place], [address] Phone: [phone]   Fax:  [phone]  Patient Details  Name: Brandon Maxwell MRN: [id number] Date of Birth: 12-27-1945 Referring Provider:  Glendon Axe, MD  Encounter Date: 02/06/2017  Pt arrived to PT complaining of getting a "charlie horse" L calf 3 days ago while walking on the track. It has persisted for the past 3 days despite stretching. It hurts when walking on it. Upon palpation by PT, L calf had increased tightness compared to R. PT informed staff at pt's PCP re: Sx and requested that pt get assessed and r/o DVT. Pt was scheduled for an appt this morning with a PA 9:30am.    Update on pt's suprapubic Sx for which he was referred to pelvic health PT: Pt reports his suprapubic Sx has not bothered him this past week since last session.     Jerl Mina ,PT, DPT, E-RYT  02/06/2017, 8:09 AM  [place] MAIN [facility] SERVICES [address] [place], [place], [address] Phone: [phone]   Fax:  [phone]

## 2017-02-14 ENCOUNTER — Ambulatory Visit: Payer: Medicare Other | Admitting: Physical Therapy

## 2017-02-14 VITALS — BP 120/74

## 2017-02-14 DIAGNOSIS — M6208 Separation of muscle (nontraumatic), other site: Secondary | ICD-10-CM

## 2017-02-14 DIAGNOSIS — M6281 Muscle weakness (generalized): Secondary | ICD-10-CM

## 2017-02-14 DIAGNOSIS — M791 Myalgia, unspecified site: Secondary | ICD-10-CM

## 2017-02-14 NOTE — Therapy (Signed)
[place] MAIN [facility] SERVICES [address] [place], [place], [address] Phone: [phone]   Fax:  [phone]  Physical Therapy Treatment / Discharge Note   Patient Details  Name: Brandon Maxwell MRN: [id number] Date of Birth: Sep 03, 1945 No Data Recorded  Encounter Date: 02/14/2017  PT End of Session - 02/14/17 0847    Visit Number  5    Number of Visits  12    Authorization Type  g code     PT Start Time  0807    PT Stop Time  0859    PT Time Calculation (min)  52 min    Activity Tolerance  Patient tolerated treatment well;No increased pain       Past Medical History:  Diagnosis Date  . Anemia   . BPH (benign prostatic hyperplasia)   . BPH (benign prostatic hyperplasia)   . Diabetes ([place])   . Hematospermia   . HLD (hyperlipidemia)   . Hypertension   . Obesity   . Prostatitis     Past Surgical History:  Procedure Laterality Date  . CORONARY ANGIOPLASTY WITH STENT PLACEMENT    . GLAUCOMA SURGERY    . rectal fissure repair      Vitals:   02/14/17 0837 02/14/17 0858  BP: 132/74 120/74    Subjective Assessment - 02/14/17 0817    Subjective  Pt reports he has not had the pubic bone discomfort the past two weeks. Pt reports he was told low K+ was the cause for his leg cramps when he visited his PCP  last week. Pt was told to eat more bananas and not prescribed any medications. Pt's leg cramps have been okay for the past few days.  Pt reports no change to discolored semen      Pertinent History  Hx of herniated disk in back, anal fissures with surgery    Patient Stated Goals  Pt would like to get rid of gall stones but is not sure what PT can do for it.           [facility] PT Assessment - 02/14/17 0820      Observation/Other Assessments   Observations  upper trap overuse with lat pull down exercise . required cuing to decrease      Palpation   Spinal mobility  proper rib expansion       Bed Mobility   Bed Mobility  -- poor  carry over with log rolling technique                Pelvic Floor Special Questions - 02/14/17 0820    External Perineal Exam  pt consented verbally         [facility] Adult PT Treatment/Exercise - 02/14/17 1062      Neuro Re-ed    Neuro Re-ed Details   see pt instructions              PT Education - 02/14/17 0829    Education provided  Yes    Education Details  HEP    Person(s) Educated  Patient    Methods  Explanation;Demonstration;Tactile cues;Verbal cues;Handout    Comprehension  Verbalized understanding;Returned demonstration;Verbal cues required          PT Long Term Goals - 02/14/17 6948      PT LONG TERM GOAL #1   Title  Pt will report decreased days when the pubic bone pain comes on from 2-3 days/ week to < 2 days in order to minimize  pubic pain and be able to sit with more confort    Time  12    Period  Weeks    Status  Achieved      PT LONG TERM GOAL #2   Title  Pt will demo no R sided pelvic floor (puborectalis mm tensions) in order to improve GI and bowel function      Time  12    Period  Weeks    Status  Achieved      PT LONG TERM GOAL #3   Title  Pt will demo decreasded fingers separation of 3 fingers to < 2 fingers width along linea alba in order to improve postural stability, minimize supinc pain, and improve motility    Time  12    Period  Weeks    Status  Achieved      PT LONG TERM GOAL #4   Title  Pt will decrease his [place] score from 21% to < 16% in order to improve QOL  ( 11/14: 12%)     Time  12    Period  Weeks    Status  Achieved      PT LONG TERM GOAL #5   Title  Pt will decrease his COREFO score from 20% to < 15% in order to restore bowel function and less diarhea  (11/14: 16%)     Time  12    Period  Weeks    Status  Achieved      PT LONG TERM GOAL #6   Title  Pt will report improved stool consistency from Type 7 to 4-5 [facility] Stool Type) in order to minimize excessive wiping    Time  12    Period  Weeks    Status   Achieved            Plan - 02/14/17 0900    Clinical Impression Statement  Pt has achieved 100% of his goals. Pt has not had suprapubic discmfort for the past 2 weeks. Pt demo'd no pelvic floor tensions, signficantly less diastasis recti, more upright posture, and improved deep core coordination/ strength. Pt states his Sx have improved " Quite a Bit Better" based on the GROC scale.  Pt voiced understanding on continuing with thoracolumbar strengthening to improved spinal alignment following d/c . Pt also voiced he plans continues to continue HEP for long lasting benefits.  Pt is ready for d/c.     Rehab Potential  Good    PT Frequency  1x / week    PT Duration  12 weeks    PT Treatment/Interventions  Patient/family education;Therapeutic activities;Therapeutic exercise;Stair training;Moist Heat;Neuromuscular re-education;Manual lymph drainage;Manual techniques;Scar mobilization;Energy conservation;Traction;Gait training;Functional mobility training    Consulted and Agree with Plan of Care  Patient       Patient will benefit from skilled therapeutic intervention in order to improve the following deficits and impairments:  Increased fascial restricitons, Postural dysfunction, Pain, Improper body mechanics, Decreased scar mobility, Decreased endurance, Decreased coordination, Decreased range of motion, Decreased mobility, Increased muscle spasms, Impaired sensation, Decreased activity tolerance, Decreased strength  Visit Diagnosis: Myalgia  Muscle weakness (generalized)  Diastasis recti     Problem List Patient Active Problem List   Diagnosis Date Noted  . Pure hypercholesterolemia 10/08/2015  . Dizziness 07/30/2015  . Perennial allergic rhinitis 04/20/2015  . Dysuria 12/20/2014  . BPH with obstruction/lower urinary tract symptoms 11/23/2014  . Hematospermia 11/23/2014  . Type 2 diabetes mellitus without complications ([place]) 16/01/9603  . Hypomagnesemia 07/01/2014  .  Combined  fat and carbohydrate induced hyperlipemia 07/01/2014  . Anemia, unspecified 03/25/2014  . Essential hypertension 12/23/2013  . Atherosclerotic heart disease of native coronary artery without angina pectoris 10/27/2013  . HLD (hyperlipidemia) 09/23/2013  . Benign prostatic hyperplasia with urinary obstruction 12/21/2011  . Chronic prostatitis 12/21/2011  . Disorder of male genital organs 12/21/2011  . Incomplete bladder emptying 12/21/2011  . Retention of urine 12/21/2011    Jerl Mina ,PT, DPT, E-RYT  02/14/2017, 3:10 PM  [place] MAIN [facility] SERVICES [address] [place], [place], [address] Phone: [phone]   Fax:  [phone]  Name: Brandon Maxwell MRN: [id number] Date of Birth: 12-May-1945

## 2017-02-14 NOTE — Patient Instructions (Addendum)
To help with less forward head and to bring shoulders back and down  Shoulder squeeze/ head press laying on your back 5 sec holds , counting aloud   10 reps  X 2 day    ___________  Lat pull down  band on other side of the door knob Squeeze shoulder blades together first,  Inhale, exhale, pull bands past your pocket without lifting shoulders up  10 x 2 right foot forward,   10 x 2 left foot forward  Feet hip width apart    __________     Avoid straining pelvic floor, abdominal muscles , spine  Use log rolling technique instead of getting out of bed with your neck or the sit-up   Log rolling out of .bed  L  arm overhead  Raise hips and scoot hips to R   Drop knees to L,  scooting L shoulder back to get completely on your L side so your shoulders, hips, and knees point to the L    Then breathe as you drop feet off bed and prop onto L elbow and  use both hands to push yourself

## 2017-02-28 ENCOUNTER — Encounter: Payer: Medicare Other | Admitting: Physical Therapy

## 2017-03-14 ENCOUNTER — Encounter: Payer: Medicare Other | Admitting: Physical Therapy

## 2017-03-22 ENCOUNTER — Other Ambulatory Visit: Payer: Self-pay | Admitting: Otolaryngology

## 2017-03-22 DIAGNOSIS — R221 Localized swelling, mass and lump, neck: Secondary | ICD-10-CM

## 2017-03-29 ENCOUNTER — Encounter: Payer: Medicare Other | Admitting: Physical Therapy

## 2017-04-11 ENCOUNTER — Ambulatory Visit
Admission: RE | Admit: 2017-04-11 | Discharge: 2017-04-11 | Disposition: A | Discharge status: Home / Self Care | Payer: Medicare Other | Source: Ambulatory Visit | Attending: Otolaryngology | Admitting: Otolaryngology

## 2017-04-11 ENCOUNTER — Ambulatory Visit: Payer: Medicare Other

## 2017-04-11 DIAGNOSIS — R0989 Other specified symptoms and signs involving the circulatory and respiratory systems: Secondary | ICD-10-CM | POA: Diagnosis not present

## 2017-04-11 DIAGNOSIS — E041 Nontoxic single thyroid nodule: Secondary | ICD-10-CM | POA: Insufficient documentation

## 2017-04-11 DIAGNOSIS — R221 Localized swelling, mass and lump, neck: Secondary | ICD-10-CM | POA: Insufficient documentation

## 2017-04-11 HISTORY — DX: Basal cell carcinoma of skin, unspecified: C44.91

## 2017-04-11 LAB — POCT I-STAT CREATININE: Creatinine, Ser: 1.1 mg/dL (ref 0.61–1.24)

## 2017-04-11 MED ORDER — IOPAMIDOL (ISOVUE-300) INJECTION 61%
75.0000 mL | Freq: Once | INTRAVENOUS | Status: AC | PRN
  Administered 2017-04-11: 75 mL via INTRAVENOUS

## 2017-06-20 ENCOUNTER — Ambulatory Visit (INDEPENDENT_AMBULATORY_CARE_PROVIDER_SITE_OTHER): Payer: Medicare Other | Admitting: Vascular Surgery

## 2017-06-20 ENCOUNTER — Encounter (INDEPENDENT_AMBULATORY_CARE_PROVIDER_SITE_OTHER): Payer: Self-pay | Admitting: Vascular Surgery

## 2017-06-20 ENCOUNTER — Ambulatory Visit (INDEPENDENT_AMBULATORY_CARE_PROVIDER_SITE_OTHER): Payer: Medicare Other

## 2017-06-20 VITALS — BP 118/72 | HR 75 | Resp 16 | Wt 216.8 lb

## 2017-06-20 DIAGNOSIS — R200 Anesthesia of skin: Secondary | ICD-10-CM | POA: Diagnosis not present

## 2017-06-20 DIAGNOSIS — E119 Type 2 diabetes mellitus without complications: Secondary | ICD-10-CM

## 2017-06-20 DIAGNOSIS — E785 Hyperlipidemia, unspecified: Secondary | ICD-10-CM | POA: Diagnosis not present

## 2017-06-20 NOTE — Progress Notes (Addendum)
Subjective:    Patient ID: Brandon Maxwell, male    DOB: 07/05/1945, 72 y.o.   MRN: [id number] Chief Complaint  Patient presents with  . Follow-up    pain with two toes on right foot   Patient last seen in 2015.  The patient presents today complaining of tingling and numbness to the bilateral toes.  The patient notes this tingling and numbness has worsened over the past few months.  The patient also notes experiencing cramping to the bilateral calves with ambulation.  The patient denies any rest pain or ulceration to the bilateral lower extremity.  The patient denies any edema to the bilateral lower extremity.  The patient denies any recent surgery or trauma to the bilateral lower extremity.  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  The patient denies any fever, nausea vomiting.   Review of Systems  Constitutional: Negative.   HENT: Negative.   Eyes: Negative.   Respiratory: Negative.   Cardiovascular:       Tingling and numbness to the bilateral feet  Gastrointestinal: Negative.   Endocrine: Negative.   Genitourinary: Negative.   Musculoskeletal: Negative.   Skin: Negative.   Allergic/Immunologic: Negative.   Neurological: Negative.   Hematological: Negative.   Psychiatric/Behavioral: Negative.       Objective:   Physical Exam  Constitutional: He is oriented to person, place, and time. He appears well-developed and well-nourished. No distress.  HENT:  Head: Normocephalic.  Eyes: Conjunctivae are normal.  Neck: Normal range of motion.  Cardiovascular: Normal rate, regular rhythm, normal heart sounds and intact distal pulses.  Pulses:      Radial pulses are 2+ on the right side, and 2+ on the left side.       Dorsalis pedis pulses are 1+ on the right side, and 1+ on the left side.       Posterior tibial pulses are 2+ on the right side, and 2+ on the left side.  Palpable 2+ PT pulses bilaterally.  Colder  toes noted on exam.  Slight bluish tint to the toes located on the right foot.  Pulmonary/Chest: Effort normal and breath sounds normal.  Musculoskeletal: Normal range of motion. He exhibits no edema (No edema noted to the bilateral lower extremity).  Neurological: He is alert and oriented to person, place, and time.  Skin: He is not diaphoretic.  Less than 1 cm scattered varicosities noted to the bilateral lower extremity.  There is no stasis dermatitis, skin changes, cellulitis or ulceration to the bilateral lower extremity.  Psychiatric: He has a normal mood and affect. His behavior is normal. Judgment and thought content normal.   BP 118/72 (BP Location: Right Arm)   Pulse 75   Resp 16   Wt 216 lb 12.8 oz (98.3 kg)   BMI 32.96 kg/m   Past Medical History:  Diagnosis Date  . Anemia   . BPH (benign prostatic hyperplasia)   . BPH (benign prostatic hyperplasia)   . Diabetes ([place])   . Hematospermia   . HLD (hyperlipidemia)   . Hypertension   . Obesity   . Prostatitis   . Skin cancer, basal cell    Social History   Socioeconomic History  . Marital status: Single    Spouse name: Not on file  . Number of children: Not on file  . Years of education: Not on file  . Highest education level: Not on file  Social Needs  . Financial resource strain: Not on file  . Food insecurity - worry: Not on file  . Food insecurity - inability: Not on file  . Transportation needs - medical: Not on file  . Transportation needs - non-medical: Not on file  Occupational History  . Not on file  Tobacco Use  . Smoking status: Never Smoker  . Smokeless tobacco: Never Used  Substance and Sexual Activity  . Alcohol use: No    Alcohol/week: 0.0 oz  . Drug use: No  . Sexual activity: Not on file  Other Topics Concern  . Not on file  Social History Narrative  . Not on file   Past Surgical History:  Procedure Laterality Date  . CORONARY ANGIOPLASTY WITH STENT PLACEMENT    . GLAUCOMA SURGERY      . rectal fissure repair     Family History  Problem Relation Age of Onset  . Coronary artery disease Father   . Coronary artery disease Brother   . Nephrolithiasis Brother   . Kidney disease Neg Hx   . Prostate cancer Neg Hx   . Kidney cancer Neg Hx   . Bladder Cancer Neg Hx    Allergies  Allergen Reactions  . Sulfa Antibiotics     Other reaction(s): UNSPECIFIED      Assessment & Plan:  Patient last seen in 2015.  The patient presents today complaining of tingling and numbness to the bilateral toes.  The patient notes this tingling and numbness has worsened over the past few months.  The patient also notes experiencing cramping to the bilateral calves with ambulation.  The patient denies any rest pain or ulceration to the bilateral lower extremity.  The patient denies any edema to the bilateral lower extremity.  The patient denies any recent surgery or trauma to the bilateral lower extremity.  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  The patient denies any fever, nausea vomiting.  1. Type 2 diabetes mellitus without complication, unspecified whether long term insulin use (HCC) - Stable Encouraged good control as its slows the progression of atherosclerotic disease  2. Hyperlipidemia, unspecified hyperlipidemia type - Stable Encouraged good control as its slows the progression of atherosclerotic disease  3. 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 The patient has palpable PT and faint DP pulses on exam however I will bring him back to undergo an ABI to assure there is no contributing peripheral artery disease I will also bring the patient back to undergo a venous duplex to rule out any venous reflux as this could exacerbate the patient's neuropathy The patient was encouraged to wear graduated compression stockings (20-30 mmHg) on a daily basis. The patient was  instructed to begin wearing the stockings first thing in the morning and removing them in the evening. The patient was instructed specifically not to sleep in the stockings. Prescription given.  In addition, behavioral modification including elevation during the day will be initiated. Anti-inflammatories for pain. The patient was instructed to call the office in the interim if any worsening edema or ulcerations to the legs, feet or toes occurs. The patient expresses their understanding.  - VAS [place] ABI WITH/WO TBI; Future - VAS [place] LOWER EXTREMITY VENOUS REFLUX; Future  The patient underwent a stat ABI and I discussed his results with him after.  The patient was notable to have bilateral  triphasic tibials.  Right ABI 1.29, left ABI 1.39.  Right resting ankle-brachial index was within normal range.  No evidence of significant right lower extremity arterial disease.  Left ankle brachial index indicates noncompressible left lower arteries.  When compared to the previous ABI in June 2015 the right ABI was 1.29 and the left ABI was 1.23.  There is no indication for arterial intervention at this time.  Current Outpatient Medications on File Prior to Visit  Medication Sig Dispense Refill  . aspirin 81 MG tablet Take 81 mg by mouth daily.    . carvedilol (COREG) 3.125 MG tablet Take 3.125 mg by mouth 2 (two) times daily with a meal.    . clopidogrel (PLAVIX) 75 MG tablet Take 75 mg by mouth daily.    . COMBIGAN 0.2-0.5 % ophthalmic solution     . diclofenac (VOLTAREN) 50 MG EC tablet Take by mouth.    . dorzolamide (TRUSOPT) 2 % ophthalmic solution     . DULoxetine (CYMBALTA) 30 MG capsule Reported on 05/26/2015    . ferrous gluconate (FERGON) 324 MG tablet Take 324 mg by mouth daily with breakfast.    . finasteride (PROSCAR) 5 MG tablet TAKE 1 TABLET BY MOUTH  DAILY 90 tablet 4  . finasteride (PROSCAR) 5 MG tablet Take 1 tablet (5 mg total) by mouth daily. 90 tablet 3  . gabapentin (NEURONTIN) 400 MG  capsule     . hydrochlorothiazide (MICROZIDE) 12.5 MG capsule TAKE ONE TABLET BY MOUTH EVERY DAY    . insulin glargine (LANTUS) 100 UNIT/ML injection Inject 50 Units into the skin.     . Insulin Pen Needle (FIFTY50 PEN NEEDLES) 31G X 5 MM MISC Use as directed.    Marland Kitchen ipratropium (ATROVENT) 0.03 % nasal spray     . latanoprost (XALATAN) 0.005 % ophthalmic solution Apply to eye.    . levocetirizine (XYZAL) 5 MG tablet TAKE 1 TABLET BY MOUTH  EVERY EVENING    . lovastatin (MEVACOR) 40 MG tablet Take 40 mg by mouth at bedtime.    . magnesium oxide (MAG-OX) 400 MG tablet Take by mouth.    . meloxicam (MOBIC) 7.5 MG tablet     . metFORMIN (GLUCOPHAGE) 1000 MG tablet Take 1,000 mg by mouth 2 (two) times daily with a meal. Reported on 05/26/2015    . mometasone (ELOCON) 0.1 % lotion Reported on 05/26/2015    . Multiple Vitamin (MULTI-VITAMINS) TABS Take by mouth.    . Multiple Vitamin (MULTI-VITAMINS) TABS Take by mouth.    . pantoprazole (PROTONIX) 40 MG tablet Take by mouth.    . pioglitazone (ACTOS) 45 MG tablet Take 45 mg by mouth daily.    . tamsulosin (FLOMAX) 0.4 MG CAPS capsule Take 1 capsule (0.4 mg total) by mouth daily. 90 capsule 4  . tamsulosin (FLOMAX) 0.4 MG CAPS capsule Take 1 capsule (0.4 mg total) by mouth daily. 90 capsule 6  . TRULICITY 5.73 UK/0.2RK SOPN     . valsartan (DIOVAN) 80 MG tablet Take 80 mg by mouth daily.    . clopidogrel (PLAVIX) 75 MG tablet TAKE 1 TABLET BY MOUTH ONCE DAILY    . doxycycline (VIBRAMYCIN) 100 MG capsule Take 1 capsule (100 mg total) by mouth every 12 (twelve) hours. (Patient not taking: Reported on 06/20/2017) 60 capsule 0  . Dulaglutide (TRULICITY [place]) Inject into the skin.    Marland Kitchen glipiZIDE (GLUCOTROL XL) 5 MG 24 hr tablet     . levocetirizine (XYZAL) 5 MG tablet  Take by mouth.    . pantoprazole (PROTONIX) 40 MG tablet TAKE ONE TABLET BY MOUTH EVERY DAY     No current facility-administered medications on file prior to visit.    There are no Patient  Instructions on file for this visit. No Follow-up on file.  Adlyn Fife A Elenie Coven, PA-C

## 2017-07-18 ENCOUNTER — Ambulatory Visit (INDEPENDENT_AMBULATORY_CARE_PROVIDER_SITE_OTHER): Payer: Medicare Other | Admitting: Vascular Surgery

## 2017-07-18 ENCOUNTER — Ambulatory Visit (INDEPENDENT_AMBULATORY_CARE_PROVIDER_SITE_OTHER): Payer: Medicare Other

## 2017-07-18 ENCOUNTER — Encounter (INDEPENDENT_AMBULATORY_CARE_PROVIDER_SITE_OTHER): Payer: Self-pay | Admitting: Vascular Surgery

## 2017-07-18 VITALS — BP 118/65 | HR 88 | Resp 16 | Ht 68.0 in | Wt 219.5 lb

## 2017-07-18 DIAGNOSIS — I872 Venous insufficiency (chronic) (peripheral): Secondary | ICD-10-CM | POA: Diagnosis not present

## 2017-07-18 DIAGNOSIS — I890 Lymphedema, not elsewhere classified: Secondary | ICD-10-CM

## 2017-07-18 DIAGNOSIS — R200 Anesthesia of skin: Secondary | ICD-10-CM | POA: Diagnosis not present

## 2017-07-18 NOTE — Progress Notes (Signed)
Subjective:    Patient ID: Brandon Maxwell, male    DOB: [birth year], 72 y.o.   MRN: [id number] Chief Complaint  Patient presents with  . Follow-up    pt conv bil ven reflux   Patient presents to review vascular studies.  The patient was last seen on June 20, 2017 for evaluation of pain and tingling to the patient's lower extremity.  Since our initial visit, the patient has been engaging in conservative therapy including wearing medical grade 1 compression socks, elevating his legs and remaining active with minimal improvement in his symptoms.  The patient notes that his symptoms have progressed to the point that he is unable to function on a daily basis.  The patient notes that his symptoms are lifestyle limiting.  The patient underwent a bilateral lower extremity venous duplex exam which was notable for right: Venous reflux in the common femoral vein femoral vein in the thigh popliteal vein and great saphenous vein at the groin.  There was no venous insufficiency noted to the left lower extremity.  There was no deep vein or superficial venous thrombosis to the bilateral lower extremity.  The patient denies any fever, nausea vomiting.  Review of Systems  Constitutional: Negative.   HENT: Negative.   Eyes: Negative.   Respiratory: Negative.   Cardiovascular: Positive for leg swelling.  Gastrointestinal: Negative.   Endocrine: Negative.   Genitourinary: Negative.   Musculoskeletal: Negative.   Skin: Negative.   Allergic/Immunologic: Negative.   Neurological: Negative.   Hematological: Negative.   Psychiatric/Behavioral: Negative.       Objective:   Physical Exam  Constitutional: He is oriented to person, place, and time. He appears well-developed and well-nourished. No distress.  HENT:  Head: Normocephalic and atraumatic.  Right Ear: External ear normal.  Left Ear: External ear normal.  Eyes: Pupils are equal, round, and reactive to light. Conjunctivae and EOM are normal.  Neck:  Normal range of motion.  Cardiovascular: Normal rate, regular rhythm, normal heart sounds and intact distal pulses.  Pulmonary/Chest: Effort normal.  Musculoskeletal: Normal range of motion. He exhibits edema (Mild nonpitting bilateral lower extremity edema noted).  Neurological: He is alert and oriented to person, place, and time.  Skin: He is not diaphoretic.  Less than 1 cm scattered varicosities noted to the bilateral lower extremity.  There is no stasis dermatitis, skin changes, cellulitis or ulceration to the bilateral lower extremity  Psychiatric: He has a normal mood and affect. His behavior is normal. Judgment and thought content normal.  Vitals reviewed.  BP 118/65 (BP Location: Right Arm)   Pulse 88   Resp 16   Ht 5\' 8"  (1.727 m)   Wt 219 lb 8 oz (99.6 kg)   BMI 33.37 kg/m   Past Medical History:  Diagnosis Date  . Anemia   . BPH (benign prostatic hyperplasia)   . BPH (benign prostatic hyperplasia)   . Diabetes ([place])   . Hematospermia   . HLD (hyperlipidemia)   . Hypertension   . Obesity   . Prostatitis   . Skin cancer, basal cell    Social History   Socioeconomic History  . Marital status: Single    Spouse name: Not on file  . Number of children: Not on file  . Years of education: Not on file  . Highest education level: Not on file  Occupational History  . Not on file  Social Needs  . Financial resource strain: Not on file  . Food insecurity:  Worry: Not on file    Inability: Not on file  . Transportation needs:    Medical: Not on file    Non-medical: Not on file  Tobacco Use  . Smoking status: Never Smoker  . Smokeless tobacco: Never Used  Substance and Sexual Activity  . Alcohol use: No    Alcohol/week: 0.0 oz  . Drug use: No  . Sexual activity: Not on file  Lifestyle  . Physical activity:    Days per week: Not on file    Minutes per session: Not on file  . Stress: Not on file  Relationships  . Social connections:    Talks on phone: Not  on file    Gets together: Not on file    Attends religious service: Not on file    Active member of club or organization: Not on file    Attends meetings of clubs or organizations: Not on file    Relationship status: Not on file  . Intimate partner violence:    Fear of current or ex partner: Not on file    Emotionally abused: Not on file    Physically abused: Not on file    Forced sexual activity: Not on file  Other Topics Concern  . Not on file  Social History Narrative  . Not on file   Past Surgical History:  Procedure Laterality Date  . CORONARY ANGIOPLASTY WITH STENT PLACEMENT    . GLAUCOMA SURGERY    . rectal fissure repair     Family History  Problem Relation Age of Onset  . Coronary artery disease Father   . Coronary artery disease Brother   . Nephrolithiasis Brother   . Kidney disease Neg Hx   . Prostate cancer Neg Hx   . Kidney cancer Neg Hx   . Bladder Cancer Neg Hx    Allergies  Allergen Reactions  . Sulfa Antibiotics     Other reaction(s): UNSPECIFIED      Assessment & Plan:  Patient presents to review vascular studies.  The patient was last seen on June 20, 2017 for evaluation of pain and tingling to the patient's lower extremity.  Since our initial visit, the patient has been engaging in conservative therapy including wearing medical grade 1 compression socks, elevating his legs and remaining active with minimal improvement in his symptoms.  The patient notes that his symptoms have progressed to the point that he is unable to function on a daily basis.  The patient notes that his symptoms are lifestyle limiting.  The patient underwent a bilateral lower extremity venous duplex exam which was notable for right: Venous reflux in the common femoral vein femoral vein in the thigh popliteal vein and great saphenous vein at the groin.  There was no venous insufficiency noted to the left lower extremity.  There was no deep vein or superficial venous thrombosis to the  bilateral lower extremity.  The patient denies any fever, nausea vomiting.  1. Chronic venous insufficiency - New Patient with chronic venous insufficiency to the deep system noted on the right lower extremity on today's duplex Due to the location of the patient's venous reflux he is not a candidate for endovenous laser ablation or sclerotherapy. The patient is to continue engaging in conservative therapy including wearing his medical grade 1 compression socks, elevating his legs and remaining active  2. Lymphedema - New Despite conservative treatments including exercise, elevation and class I compression stockings the patient still presents with stage I lymphedema. The patient would  greatly benefit from the added therapy of a lymphedema pump Applied to the patient's insurance In the meantime the patient is to continue engaging in conservative therapy Will see the patient back in 3 months to assess his progress with conservative therapy and the added benefit of a lymphedema pump  Current Outpatient Medications on File Prior to Visit  Medication Sig Dispense Refill  . aspirin 81 MG tablet Take 81 mg by mouth daily.    . carvedilol (COREG) 3.125 MG tablet Take 3.125 mg by mouth 2 (two) times daily with a meal.    . clopidogrel (PLAVIX) 75 MG tablet Take 75 mg by mouth daily.    . clopidogrel (PLAVIX) 75 MG tablet TAKE 1 TABLET BY MOUTH ONCE DAILY    . COMBIGAN 0.2-0.5 % ophthalmic solution     . diclofenac (VOLTAREN) 50 MG EC tablet Take by mouth.    . dorzolamide (TRUSOPT) 2 % ophthalmic solution     . Dulaglutide (TRULICITY [place]) Inject into the skin.    . DULoxetine (CYMBALTA) 30 MG capsule Reported on 05/26/2015    . ferrous gluconate (FERGON) 324 MG tablet Take 324 mg by mouth daily with breakfast.    . finasteride (PROSCAR) 5 MG tablet TAKE 1 TABLET BY MOUTH  DAILY 90 tablet 4  . finasteride (PROSCAR) 5 MG tablet Take 1 tablet (5 mg total) by mouth daily. 90 tablet 3  . gabapentin  (NEURONTIN) 400 MG capsule     . glipiZIDE (GLUCOTROL XL) 5 MG 24 hr tablet     . hydrochlorothiazide (MICROZIDE) 12.5 MG capsule TAKE ONE TABLET BY MOUTH EVERY DAY    . insulin glargine (LANTUS) 100 UNIT/ML injection Inject 50 Units into the skin.     . Insulin Pen Needle (FIFTY50 PEN NEEDLES) 31G X 5 MM MISC Use as directed.    Marland Kitchen ipratropium (ATROVENT) 0.03 % nasal spray     . latanoprost (XALATAN) 0.005 % ophthalmic solution Apply to eye.    . levocetirizine (XYZAL) 5 MG tablet TAKE 1 TABLET BY MOUTH  EVERY EVENING    . lovastatin (MEVACOR) 40 MG tablet Take 40 mg by mouth at bedtime.    . magnesium oxide (MAG-OX) 400 MG tablet Take by mouth.    . meloxicam (MOBIC) 7.5 MG tablet     . metFORMIN (GLUCOPHAGE) 1000 MG tablet Take 1,000 mg by mouth 2 (two) times daily with a meal. Reported on 05/26/2015    . mometasone (ELOCON) 0.1 % lotion Reported on 05/26/2015    . Multiple Vitamin (MULTI-VITAMINS) TABS Take by mouth.    . Multiple Vitamin (MULTI-VITAMINS) TABS Take by mouth.    . pantoprazole (PROTONIX) 40 MG tablet Take by mouth.    . pantoprazole (PROTONIX) 40 MG tablet TAKE ONE TABLET BY MOUTH EVERY DAY    . pioglitazone (ACTOS) 45 MG tablet Take 45 mg by mouth daily.    . tamsulosin (FLOMAX) 0.4 MG CAPS capsule Take 1 capsule (0.4 mg total) by mouth daily. 90 capsule 4  . tamsulosin (FLOMAX) 0.4 MG CAPS capsule Take 1 capsule (0.4 mg total) by mouth daily. 90 capsule 6  . TRULICITY 6.31 SH/7.74YO SOPN     . valsartan (DIOVAN) 80 MG tablet Take 80 mg by mouth daily.    Marland Kitchen doxycycline (VIBRAMYCIN) 100 MG capsule Take 1 capsule (100 mg total) by mouth every 12 (twelve) hours. (Patient not taking: Reported on 06/20/2017) 60 capsule 0  . levocetirizine (XYZAL) 5 MG tablet Take by mouth.  No current facility-administered medications on file prior to visit.    There are no Patient Instructions on file for this visit. No follow-ups on file.  Tashiana Lamarca A Rithy Mandley, PA-C

## 2017-07-25 DIAGNOSIS — I890 Lymphedema, not elsewhere classified: Secondary | ICD-10-CM | POA: Insufficient documentation

## 2017-07-25 DIAGNOSIS — Z9861 Coronary angioplasty status: Secondary | ICD-10-CM

## 2017-07-25 DIAGNOSIS — I2510 Atherosclerotic heart disease of native coronary artery without angina pectoris: Secondary | ICD-10-CM | POA: Insufficient documentation

## 2017-07-26 DIAGNOSIS — M545 Low back pain, unspecified: Secondary | ICD-10-CM | POA: Insufficient documentation

## 2017-08-18 IMAGING — RF DG UGI W/ HIGH DENSITY W/O KUB
13 series · 13 of 13 positions shown · non-contrast
Comparison: 11/20/2013.

CLINICAL DATA: Indigestion.

EXAM:
UPPER GI SERIES WITHOUT KUB
TECHNIQUE: Routine upper GI series was performed with high density and thin
barium.
FLUOROSCOPY TIME:  Fluoroscopy Time:  2 minutes 18 seconds
Radiation Exposure Index (if provided by the fluoroscopic device):
69.4
Number of Acquired Spot Images: 13

[Series 1: fluoro_barium singleshot_bw · 0.19mm/px · 1 of 1 slices shown (1 of 13)]
[im 1/1]
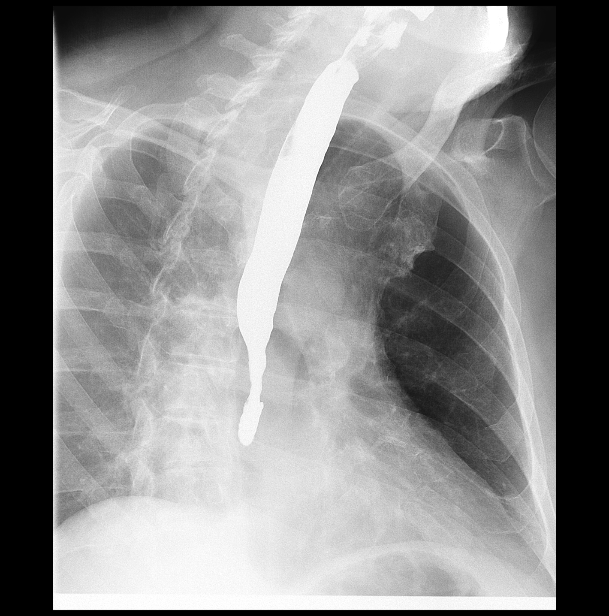

[Series 2: fluoro_barium singleshot_bw · 0.19mm/px · 1 of 1 slices shown (2 of 13)]
[im 1/1]
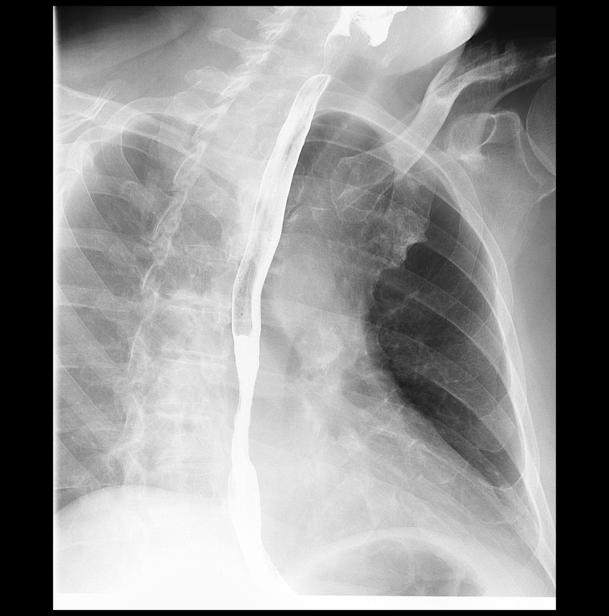

[Series 3: fluoro_barium singleshot_bw · 0.19mm/px · 1 of 1 slices shown (3 of 13)]
[im 1/1]
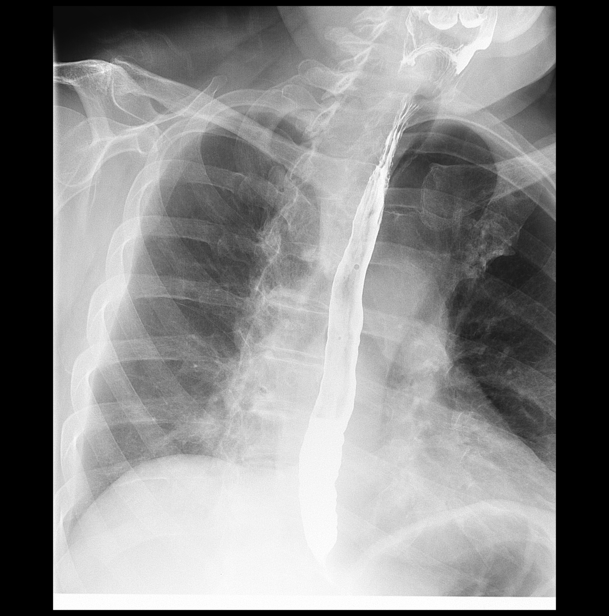

[Series 4: fluoro_barium singleshot_bw · 0.18mm/px · 1 of 1 slices shown (4 of 13)]
[im 1/1]
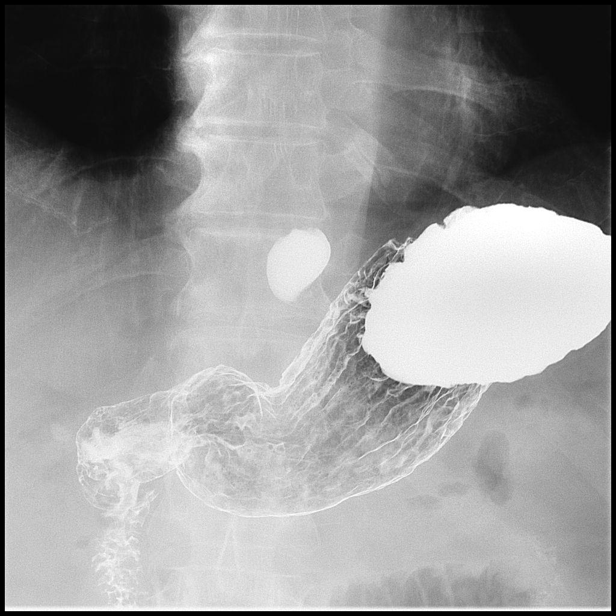

[Series 5: fluoro_barium singleshot_bw · 0.18mm/px · 1 of 1 slices shown (5 of 13)]
[im 1/1]
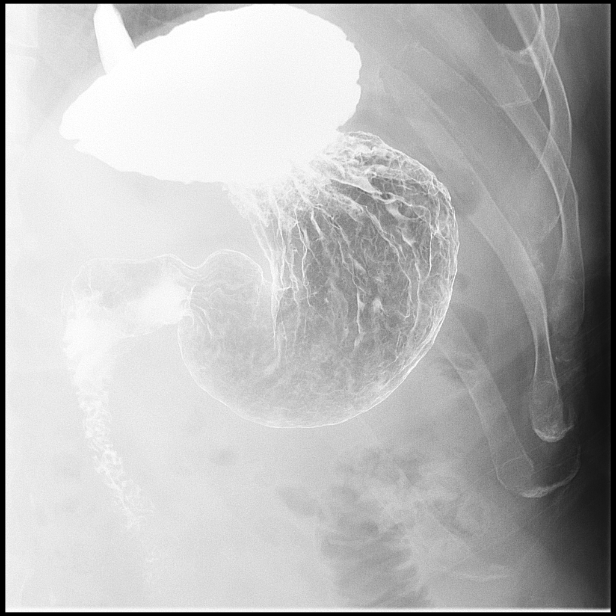

[Series 6: fluoro_barium singleshot_bw · 0.18mm/px · 1 of 1 slices shown (6 of 13)]
[im 1/1]
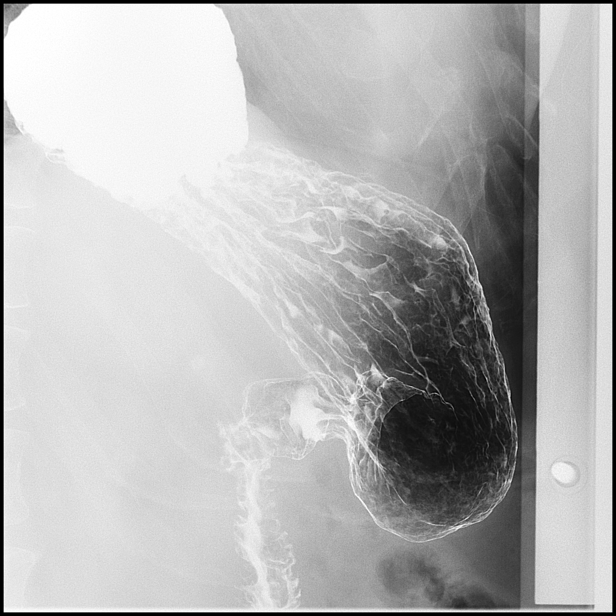

[Series 7: fluoro_barium singleshot_bw · 0.18mm/px · 1 of 1 slices shown (7 of 13)]
[im 1/1]
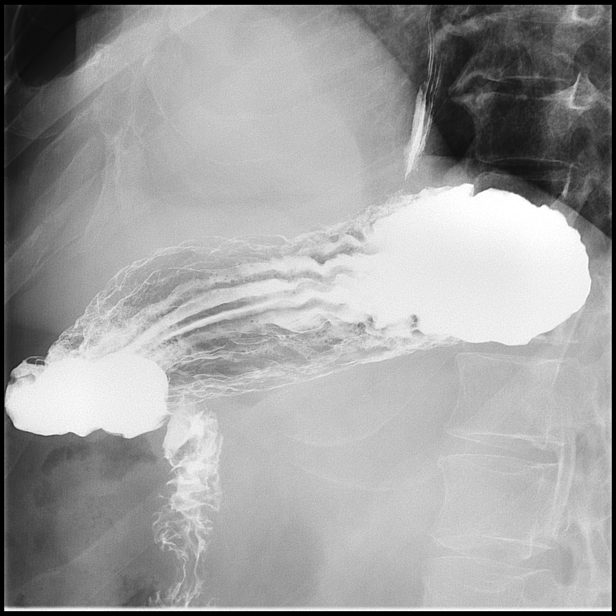

[Series 8: fluoro_barium singleshot_bw · 0.19mm/px · 1 of 1 slices shown (8 of 13)]
[im 1/1]
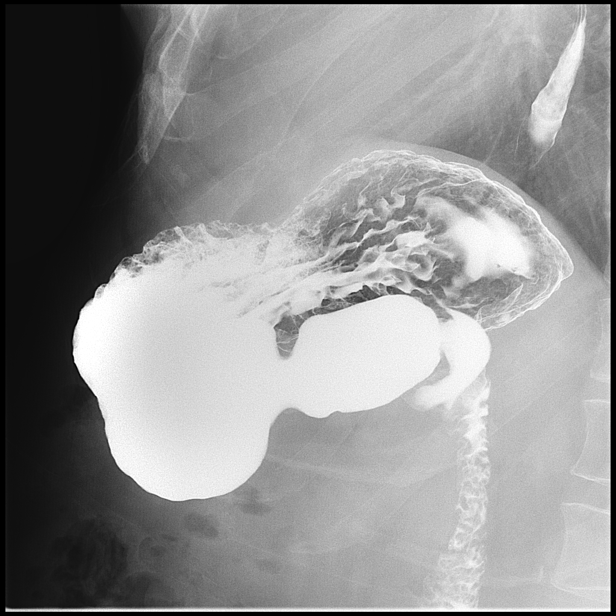

[Series 9: fluoro_barium singleshot_bw · 0.19mm/px · 1 of 1 slices shown (9 of 13)]
[im 1/1]
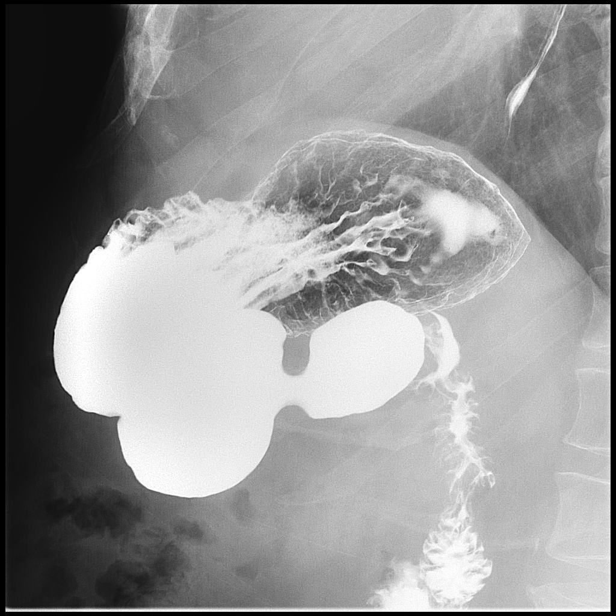

[Series 10: fluoro_barium singleshot_bw · 0.19mm/px · 1 of 1 slices shown (10 of 13)]
[im 1/1]
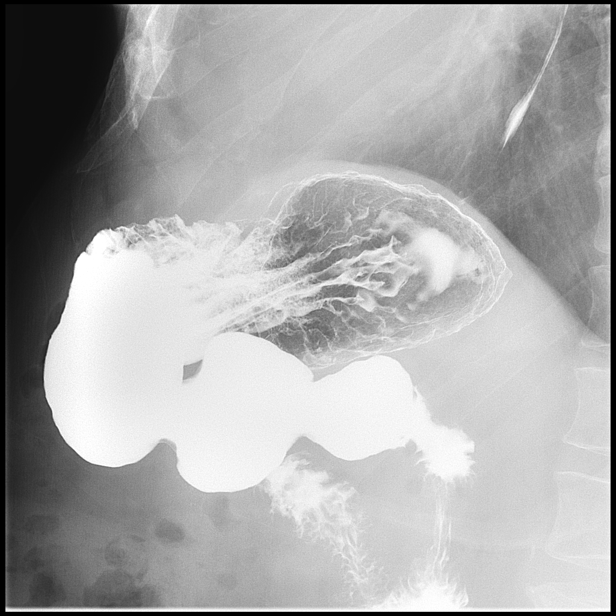

[Series 11: fluoro_barium singleshot_bw · 0.18mm/px · 1 of 1 slices shown (11 of 13)]
[im 1/1]
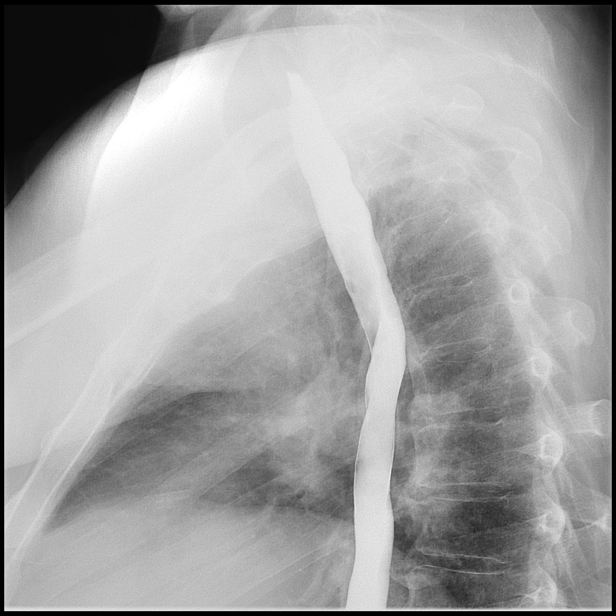

[Series 12: fluoro_barium singleshot_bw · 0.18mm/px · 1 of 1 slices shown (12 of 13)]
[im 1/1]
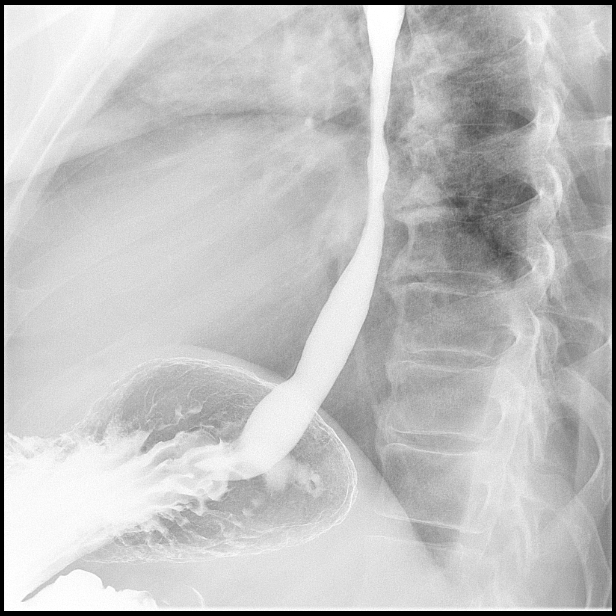

[Series 13: fluoro_barium singleshot_bw · 0.19mm/px · 1 of 1 slices shown (13 of 13)]
[im 1/1]
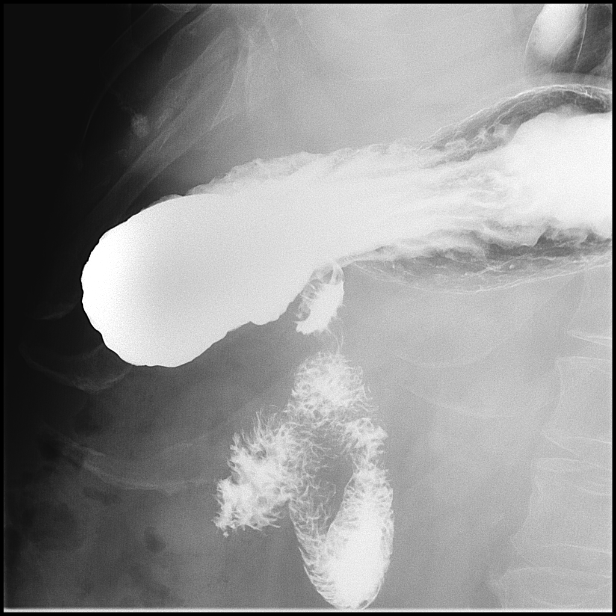

[13 of 13 positions shown; findings below may reference images not displayed]

FINDINGS: Esophagus is widely patent. Normal peristalsis. No significant
reflux. Stomach and duodenum appear normal. C-loop is normal.
IMPRESSION: Normal exam.

## 2017-10-17 ENCOUNTER — Encounter (INDEPENDENT_AMBULATORY_CARE_PROVIDER_SITE_OTHER): Payer: Self-pay | Admitting: Vascular Surgery

## 2017-10-17 ENCOUNTER — Ambulatory Visit (INDEPENDENT_AMBULATORY_CARE_PROVIDER_SITE_OTHER): Payer: Medicare Other | Admitting: Vascular Surgery

## 2017-10-17 VITALS — BP 131/76 | HR 68 | Resp 14 | Ht 68.0 in | Wt 218.0 lb

## 2017-10-17 DIAGNOSIS — I872 Venous insufficiency (chronic) (peripheral): Secondary | ICD-10-CM | POA: Diagnosis not present

## 2017-10-17 DIAGNOSIS — I890 Lymphedema, not elsewhere classified: Secondary | ICD-10-CM

## 2017-10-17 NOTE — Progress Notes (Signed)
Subjective:    Patient ID: Brandon Maxwell, male    DOB: 1945-09-29, 72 y.o.   MRN: [id number] Chief Complaint  Patient presents with  . Follow-up    3 month no studies   Patient presents for a 7-month lymphedema/chronic venous insufficiency follow-up.  During our last visit, the patient was engaging conservative therapy which is not improving his symptoms.  We applied for a lymphedema pump which she has received.  The patient's daily routine is now wearing medical grade one compression socks, elevating his legs, remaining active and using his lymphedema pump for an hour each day.  The patient notes an improvement in his bilateral lower extremity edema and discomfort.  The patient denies any claudication-like symptoms, rest pain or ulceration to the bilateral lower extremity.  The patient denies any recent bouts of cellulitis.  The patient denies any fever, nausea vomiting.  Patient is pleased with the results of the added therapy of the lymphedema pump is giving him.  Review of Systems  Constitutional: Negative.   HENT: Negative.   Eyes: Negative.   Respiratory: Negative.   Cardiovascular: Negative.   Gastrointestinal: Negative.   Endocrine: Negative.   Genitourinary: Negative.   Musculoskeletal: Negative.   Skin: Negative.   Allergic/Immunologic: Negative.   Neurological: Negative.   Hematological: Negative.   Psychiatric/Behavioral: Negative.       Objective:   Physical Exam  Constitutional: He is oriented to person, place, and time. He appears well-developed and well-nourished. No distress.  HENT:  Head: Normocephalic and atraumatic.  Right Ear: External ear normal.  Left Ear: External ear normal.  Eyes: Pupils are equal, round, and reactive to light. Conjunctivae and EOM are normal.  Neck: Normal range of motion.  Cardiovascular: Normal rate, regular rhythm, normal heart sounds and intact distal pulses.  Pulses:      Radial pulses are 2+ on the right side, and 2+ on the  left side.       Dorsalis pedis pulses are 1+ on the right side, and 1+ on the left side.       Posterior tibial pulses are 1+ on the right side, and 1+ on the left side.  Pulmonary/Chest: Effort normal and breath sounds normal.  Musculoskeletal: Normal range of motion. He exhibits no edema.  Neurological: He is alert and oriented to person, place, and time.  Skin: Skin is warm and dry. He is not diaphoretic.  Psychiatric: He has a normal mood and affect. His behavior is normal. Judgment and thought content normal.  Vitals reviewed.  BP 131/76 (BP Location: Right Arm, Patient Position: Sitting)   Pulse 68   Resp 14   Ht 5\' 8"  (1.727 m)   Wt 218 lb (98.9 kg)   BMI 33.15 kg/m   Past Medical History:  Diagnosis Date  . Anemia   . BPH (benign prostatic hyperplasia)   . BPH (benign prostatic hyperplasia)   . Diabetes ([place])   . Hematospermia   . HLD (hyperlipidemia)   . Hypertension   . Obesity   . Prostatitis   . Skin cancer, basal cell    Social History   Socioeconomic History  . Marital status: Single    Spouse name: Not on file  . Number of children: Not on file  . Years of education: Not on file  . Highest education level: Not on file  Occupational History  . Not on file  Social Needs  . Financial resource strain: Not on file  . Food insecurity:  Worry: Not on file    Inability: Not on file  . Transportation needs:    Medical: Not on file    Non-medical: Not on file  Tobacco Use  . Smoking status: Never Smoker  . Smokeless tobacco: Never Used  Substance and Sexual Activity  . Alcohol use: No    Alcohol/week: 0.0 oz  . Drug use: No  . Sexual activity: Not on file  Lifestyle  . Physical activity:    Days per week: Not on file    Minutes per session: Not on file  . Stress: Not on file  Relationships  . Social connections:    Talks on phone: Not on file    Gets together: Not on file    Attends religious service: Not on file    Active member of club or  organization: Not on file    Attends meetings of clubs or organizations: Not on file    Relationship status: Not on file  . Intimate partner violence:    Fear of current or ex partner: Not on file    Emotionally abused: Not on file    Physically abused: Not on file    Forced sexual activity: Not on file  Other Topics Concern  . Not on file  Social History Narrative  . Not on file   Past Surgical History:  Procedure Laterality Date  . CORONARY ANGIOPLASTY WITH STENT PLACEMENT    . GLAUCOMA SURGERY    . rectal fissure repair     Family History  Problem Relation Age of Onset  . Coronary artery disease Father   . Coronary artery disease Brother   . Nephrolithiasis Brother   . Kidney disease Neg Hx   . Prostate cancer Neg Hx   . Kidney cancer Neg Hx   . Bladder Cancer Neg Hx    Allergies  Allergen Reactions  . Sulfa Antibiotics     Other reaction(s): UNSPECIFIED      Assessment & Plan:  Patient presents for a 86-month lymphedema/chronic venous insufficiency follow-up.  During our last visit, the patient was engaging conservative therapy which is not improving his symptoms.  We applied for a lymphedema pump which she has received.  The patient's daily routine is now wearing medical grade one compression socks, elevating his legs, remaining active and using his lymphedema pump for an hour each day.  The patient notes an improvement in his bilateral lower extremity edema and discomfort.  The patient denies any claudication-like symptoms, rest pain or ulceration to the bilateral lower extremity.  The patient denies any recent bouts of cellulitis.  The patient denies any fever, nausea vomiting.  Patient is pleased with the results of the added therapy of the lymphedema pump is giving him.  1. Lymphedema - Stable The patient presents today with a marketed improvement in his bilateral lower extremity edema The patient presents today without complaint denies any claudication-like symptoms,  rest pain or ulceration or cellulitis to the bilateral lower extremity The patient is pleased with the overall improvement in his edema and discomfort he has received from engaging in conservative therapy and the added therapy of a lymphedema pump The patient should follow-up PRN The patient was instructed to call the office in the interim if any worsening edema or ulcerations to the legs, feet or toes occurs. The patient expresses their understanding.  2. Chronic venous insufficiency - Stable As above  Current Outpatient Medications on File Prior to Visit  Medication Sig Dispense Refill  .  aspirin 81 MG tablet Take 81 mg by mouth daily.    . carvedilol (COREG) 3.125 MG tablet Take 3.125 mg by mouth 2 (two) times daily with a meal.    . clopidogrel (PLAVIX) 75 MG tablet TAKE 1 TABLET BY MOUTH ONCE DAILY    . COMBIGAN 0.2-0.5 % ophthalmic solution     . dorzolamide (TRUSOPT) 2 % ophthalmic solution     . Dulaglutide (TRULICITY [place]) Inject into the skin.    . DULoxetine (CYMBALTA) 30 MG capsule Reported on 05/26/2015    . ferrous sulfate 325 (65 FE) MG tablet Take by mouth.    . finasteride (PROSCAR) 5 MG tablet Take 1 tablet (5 mg total) by mouth daily. 90 tablet 3  . gabapentin (NEURONTIN) 400 MG capsule     . glipiZIDE (GLUCOTROL XL) 5 MG 24 hr tablet     . hydrochlorothiazide (MICROZIDE) 12.5 MG capsule TAKE ONE TABLET BY MOUTH EVERY DAY    . ipratropium (ATROVENT) 0.03 % nasal spray     . latanoprost (XALATAN) 0.005 % ophthalmic solution Apply to eye.    . levocetirizine (XYZAL) 5 MG tablet TAKE 1 TABLET BY MOUTH  EVERY EVENING    . lovastatin (MEVACOR) 40 MG tablet Take 40 mg by mouth at bedtime.    . magnesium oxide (MAG-OX) 400 MG tablet Take by mouth.    . meloxicam (MOBIC) 7.5 MG tablet     . metFORMIN (GLUCOPHAGE) 1000 MG tablet Take 1,000 mg by mouth 2 (two) times daily with a meal. Reported on 05/26/2015    . mometasone (ELOCON) 0.1 % lotion Reported on 05/26/2015    . Multiple  Vitamin (MULTI-VITAMINS) TABS Take by mouth.    [place] Jewel Dimesylate (RHOPRESSA) 0.02 % SOLN     . pantoprazole (PROTONIX) 40 MG tablet Take by mouth.    . pioglitazone (ACTOS) 45 MG tablet Take 45 mg by mouth daily.    . tamsulosin (FLOMAX) 0.4 MG CAPS capsule Take 1 capsule (0.4 mg total) by mouth daily. 90 capsule 4  . TRULICITY 0.30 DT/1.4HO SOPN     . valsartan (DIOVAN) 80 MG tablet Take 80 mg by mouth daily.    . insulin glargine (LANTUS) 100 UNIT/ML injection Inject 50 Units into the skin.     Marland Kitchen levocetirizine (XYZAL) 5 MG tablet Take by mouth.     No current facility-administered medications on file prior to visit.    There are no Patient Instructions on file for this visit. No follow-ups on file.  Kanai Hilger A Trevione Wert, PA-C

## 2017-11-16 ENCOUNTER — Other Ambulatory Visit: Payer: Self-pay | Admitting: Family Medicine

## 2017-11-16 DIAGNOSIS — N138 Other obstructive and reflux uropathy: Secondary | ICD-10-CM

## 2017-11-16 DIAGNOSIS — N401 Enlarged prostate with lower urinary tract symptoms: Principal | ICD-10-CM

## 2017-11-20 ENCOUNTER — Other Ambulatory Visit: Payer: Medicare Other

## 2017-11-20 DIAGNOSIS — N138 Other obstructive and reflux uropathy: Secondary | ICD-10-CM

## 2017-11-20 DIAGNOSIS — N401 Enlarged prostate with lower urinary tract symptoms: Principal | ICD-10-CM

## 2017-11-21 LAB — PSA: Prostate Specific Ag, Serum: 0.4 ng/mL (ref 0.0–4.0)

## 2017-11-21 NOTE — Progress Notes (Signed)
8:43 AM   Brandon Maxwell 08-29-1945 [id number]  Referring provider: Glendon Axe, MD [place] [facility] [place], [place] [address]  Chief Complaint  Patient presents with  . Benign Prostatic Hypertrophy    1year    HPI: Mr. Brandon Maxwell is 72 year old Caucasian male with a history of hematospermia and BPH with LUTS who presents today for a 6 month follow up.    BPH WITH LUTS: His IPSS score today is 16, which is moderate lower urinary tract symptomatology. He is mixed with his quality life due to his urinary symptoms. His previous IPSS score was 13/3.  His previous PVR is 0 mL.  His major complaint(s) today are/is frequency, urgency and intermittency.  He denies any dysuria and hematuria.   He currently taking finasteride and tamsulosin.  His has had a cystoscopic examination in 12/2012 by Dr. Jacqlyn Larsen and it was noted that the prostatic urethra demonstrated moderate bilobar prostatic hypertrophy, partial visual obstruction and prominent hypervascularity.    Non contrast CT in 12/2016 was negative.    When asked if he could improve one of his urinary symptoms, which one would it be?  He said, "Get an erection."    IPSS    Row Name 11/22/17 0800         International Prostate Symptom Score   How often have you had the sensation of not emptying your bladder?  Less than 1 in 5     How often have you had to urinate less than every two hours?  About half the time     How often have you found you stopped and started again several times when you urinated?  Less than half the time     How often have you found it difficult to postpone urination?  About half the time     How often have you had a weak urinary stream?  Less than half the time     How often have you had to strain to start urination?  Less than half the time     How many times did you typically get up at night to urinate?  3 Times     Total IPSS Score  16       Quality of Life due to urinary symptoms   If you were to spend the rest of your life with your urinary condition just the way it is now how would you feel about that?  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  When he reduced the dose of his ASA from 325 mg to 81 mg, hematospermia did abate.  He is not having pain or curvature with ejaculation.   PMH: Past Medical History:  Diagnosis Date  . Anemia   . BPH (benign prostatic hyperplasia)   . BPH (benign prostatic hyperplasia)   . Diabetes ([place])   . Hematospermia   . HLD (hyperlipidemia)   . Hypertension   . Obesity   . Prostatitis   . Skin cancer, basal cell     Surgical History: Past Surgical History:  Procedure Laterality Date  . CORONARY ANGIOPLASTY WITH STENT PLACEMENT    . GLAUCOMA SURGERY    . rectal fissure repair      Home Medications:  Allergies as of 11/22/2017      Reactions   Sulfa Antibiotics    Other reaction(s): UNSPECIFIED      Medication List  Accurate as of 11/22/17  8:43 AM. Always use your most recent med list.          aspirin 81 MG tablet Take 81 mg by mouth daily.   carvedilol 3.125 MG tablet Commonly known as:  COREG Take 3.125 mg by mouth 2 (two) times daily with a meal.   clopidogrel 75 MG tablet Commonly known as:  PLAVIX TAKE 1 TABLET BY MOUTH ONCE DAILY   COMBIGAN 0.2-0.5 % ophthalmic solution Generic drug:  brimonidine-timolol   dorzolamide 2 % ophthalmic solution Commonly known as:  TRUSOPT   DULoxetine 30 MG capsule Commonly known as:  CYMBALTA Reported on 05/26/2015   ferrous sulfate 325 (65 FE) MG tablet Take by mouth.   finasteride 5 MG tablet Commonly known as:  PROSCAR Take 1 tablet (5 mg total) by mouth daily.   gabapentin 400 MG capsule Commonly known as:  NEURONTIN   glipiZIDE 5 MG 24 hr tablet Commonly known as:  GLUCOTROL XL   hydrochlorothiazide 12.5 MG capsule Commonly known as:  MICROZIDE TAKE ONE TABLET BY MOUTH EVERY DAY   insulin  glargine 100 UNIT/ML injection Commonly known as:  LANTUS Inject 50 Units into the skin.   ipratropium 0.03 % nasal spray Commonly known as:  ATROVENT   latanoprost 0.005 % ophthalmic solution Commonly known as:  XALATAN Apply to eye.   levocetirizine 5 MG tablet Commonly known as:  XYZAL Take by mouth.   levocetirizine 5 MG tablet Commonly known as:  XYZAL TAKE 1 TABLET BY MOUTH  EVERY EVENING   lovastatin 40 MG tablet Commonly known as:  MEVACOR Take 40 mg by mouth at bedtime.   magnesium oxide 400 MG tablet Commonly known as:  MAG-OX Take by mouth.   meloxicam 7.5 MG tablet Commonly known as:  MOBIC   metFORMIN 1000 MG tablet Commonly known as:  GLUCOPHAGE Take 1,000 mg by mouth 2 (two) times daily with a meal. Reported on 05/26/2015   mometasone 0.1 % lotion Commonly known as:  ELOCON Reported on 05/26/2015   MULTI-VITAMINS Tabs Take by mouth.   pantoprazole 40 MG tablet Commonly known as:  PROTONIX Take by mouth.   pioglitazone 45 MG tablet Commonly known as:  ACTOS Take 45 mg by mouth daily.   RHOPRESSA 0.02 % Soln Generic drug:  Netarsudil Dimesylate   sildenafil 100 MG tablet Commonly known as:  VIAGRA Take 1 tablet (100 mg total) by mouth daily as needed for erectile dysfunction. Take two hours prior to intercourse on an empty stomach   tamsulosin 0.4 MG Caps capsule Commonly known as:  FLOMAX Take 1 capsule (0.4 mg total) by mouth daily.   TRULICITY [place] Inject into the skin.   TRULICITY 3.53 IR/4.4RX Sopn Generic drug:  Dulaglutide   valsartan 80 MG tablet Commonly known as:  DIOVAN Take 80 mg by mouth daily.       Allergies:  Allergies  Allergen Reactions  . Sulfa Antibiotics     Other reaction(s): UNSPECIFIED    Family History: Family History  Problem Relation Age of Onset  . Coronary artery disease Father   . Coronary artery disease Brother   . Nephrolithiasis Brother   . Kidney disease Neg Hx   . Prostate cancer Neg Hx    . Kidney cancer Neg Hx   . Bladder Cancer Neg Hx     Social History:  reports that he has never smoked. He has never used smokeless tobacco. He reports that he does not drink alcohol or  use drugs.  ROS: UROLOGY Frequent Urination?: Yes Hard to postpone urination?: Yes Burning/pain with urination?: No Get up at night to urinate?: No Leakage of urine?: No Urine stream starts and stops?: Yes Trouble starting stream?: No Do you have to strain to urinate?: No Blood in urine?: No Urinary tract infection?: No Sexually transmitted disease?: No Injury to kidneys or bladder?: No Painful intercourse?: No Weak stream?: No Erection problems?: Yes Penile pain?: No  Gastrointestinal Nausea?: No Vomiting?: No Indigestion/heartburn?: No Diarrhea?: No Constipation?: No  Constitutional Fever: No Night sweats?: No Weight loss?: No Fatigue?: No  Skin Skin rash/lesions?: No Itching?: No  Eyes Blurred vision?: No Double vision?: No  Ears/Nose/Throat Sore throat?: No Sinus problems?: No  Hematologic/Lymphatic Swollen glands?: No Easy bruising?: Yes  Cardiovascular Leg swelling?: No Chest pain?: No  Respiratory Cough?: Yes Shortness of breath?: No  Endocrine Excessive thirst?: No  Musculoskeletal Back pain?: Yes Joint pain?: No  Neurological Headaches?: No Dizziness?: No  Psychologic Depression?: No Anxiety?: No  Physical Exam: BP 128/65   Pulse 76   Ht 5\' 8"  (1.727 m)   Wt 220 lb (99.8 kg)   BMI 33.45 kg/m   Constitutional: Well nourished. Alert and oriented, No acute distress. HEENT: [place] AT, moist mucus membranes. Trachea midline, no masses. Cardiovascular: No clubbing, cyanosis, or edema. Respiratory: Normal respiratory effort, no increased work of breathing. GI: Abdomen is soft, non tender, non distended, no abdominal masses. Liver and spleen not palpable.  No hernias appreciated.  Stool sample for occult testing is not indicated.   GU: No CVA  tenderness.  No bladder fullness or masses.  Patient with uncircumcised phallus.   Foreskin easily retracted.   Urethral meatus is patent.  No penile discharge. No penile lesions or rashes. Scrotum without lesions, cysts, rashes and/or edema.  Testicles are located scrotally bilaterally. No masses are appreciated in the testicles. Left and right epididymis are normal. Rectal: Patient with  normal sphincter tone. Anus and perineum without scarring or rashes. No rectal masses are appreciated. Prostate is approximately 45 grams, no nodules are appreciated. Seminal vesicles are normal. Skin: No rashes, bruises or suspicious lesions. Lymph: No cervical or inguinal adenopathy. Neurologic: Grossly intact, no focal deficits, moving all 4 extremities. Psychiatric: Normal mood and affect.  Laboratory Data: PSA    1.2 (2.4) ng/mL on 05/25/2014             0.5 (1.0) ng/mL on 11/23/2014             0.8 (1.6)ng/mL on 11/19/2015             0.8 (1.6) ng/mL on 11/15/2016  0.4 (0.8) in 11/2017  I have reviewed the labs  Assessment & Plan:    1. BPH (benign prostatic hyperplasia) with LU TS IPSS score is 16/3, it is worsening Continue conservative management, avoiding bladder irritants and timed voiding's Most bothersome symptoms is/are ED Continue tamsulosin 0.4 mg and finasteride 5 mg daily; refills given RTC in 6 months for IPSS, PSA and exam   2. Hematospermia Still has dark colored semen On ASA and Plavix No gross hematuria  3. Erectile dysfunction A recent study published in Sex Med 2018 Apr 13 revealed moderate to vigorous aerobic exercise for 40 minutes 4 times per week can decrease erectile problems caused by physical inactivity, obesity, hypertension, metabolic syndrome and/or cardiovascular diseases - he walks track several times a week We discussed trying a PDE5 inhibitor - Sildenafil 100 mg, 1 tablet two hours prior to  intercourse on an empty stomach, # 25; he is warned not to take  medications that contain nitrates.  I also advised him of the side effects, such as: headache, flushing, dyspepsia, abnormal vision, nasal congestion, back pain, myalgia, nausea, dizziness, and rash. RTC in 6 months for SHIM and exam    Return in about 6 months (around 05/25/2018) for IPSS, SHIM, PSA and exam.  Zara Council, [facility]  [address] [place], [place] [address] [phone]

## 2017-11-22 ENCOUNTER — Ambulatory Visit: Payer: Medicare Other | Admitting: Urology

## 2017-11-22 ENCOUNTER — Encounter: Payer: Self-pay | Admitting: Urology

## 2017-11-22 VITALS — BP 128/65 | HR 76 | Ht 68.0 in | Wt 220.0 lb

## 2017-11-22 DIAGNOSIS — N138 Other obstructive and reflux uropathy: Secondary | ICD-10-CM | POA: Diagnosis not present

## 2017-11-22 DIAGNOSIS — R361 Hematospermia: Secondary | ICD-10-CM

## 2017-11-22 DIAGNOSIS — N401 Enlarged prostate with lower urinary tract symptoms: Secondary | ICD-10-CM

## 2017-11-22 MED ORDER — FINASTERIDE 5 MG PO TABS: 5.0000 mg | ORAL_TABLET | Freq: Every day | ORAL | 3 refills | Status: DC

## 2017-11-22 MED ORDER — SILDENAFIL CITRATE 100 MG PO TABS: 100.0000 mg | ORAL_TABLET | Freq: Every day | ORAL | 11 refills | Status: AC | PRN

## 2017-11-22 MED ORDER — TAMSULOSIN HCL 0.4 MG PO CAPS: 0.4000 mg | ORAL_CAPSULE | Freq: Every day | ORAL | 3 refills | Status: DC

## 2018-03-08 ENCOUNTER — Other Ambulatory Visit: Payer: Self-pay | Admitting: Physical Medicine and Rehabilitation

## 2018-03-08 DIAGNOSIS — M5416 Radiculopathy, lumbar region: Secondary | ICD-10-CM

## 2018-03-28 ENCOUNTER — Ambulatory Visit: Payer: Medicare Other

## 2018-03-28 ENCOUNTER — Ambulatory Visit
Admission: RE | Admit: 2018-03-28 | Discharge: 2018-03-28 | Disposition: A | Discharge status: Home / Self Care | Payer: Medicare Other | Source: Ambulatory Visit | Attending: Physical Medicine and Rehabilitation | Admitting: Physical Medicine and Rehabilitation

## 2018-03-28 DIAGNOSIS — E882 Lipomatosis, not elsewhere classified: Secondary | ICD-10-CM | POA: Diagnosis not present

## 2018-03-28 DIAGNOSIS — M5416 Radiculopathy, lumbar region: Secondary | ICD-10-CM | POA: Insufficient documentation

## 2018-03-28 DIAGNOSIS — M5136 Other intervertebral disc degeneration, lumbar region: Secondary | ICD-10-CM | POA: Insufficient documentation

## 2018-03-28 DIAGNOSIS — M48061 Spinal stenosis, lumbar region without neurogenic claudication: Secondary | ICD-10-CM | POA: Insufficient documentation

## 2018-03-28 DIAGNOSIS — M5137 Other intervertebral disc degeneration, lumbosacral region: Secondary | ICD-10-CM | POA: Diagnosis not present

## 2018-05-03 ENCOUNTER — Encounter: Payer: Self-pay | Admitting: *Deleted

## 2018-05-06 ENCOUNTER — Ambulatory Visit: Payer: Medicare Other | Admitting: Anesthesiology

## 2018-05-06 ENCOUNTER — Encounter: Payer: Self-pay | Admitting: *Deleted

## 2018-05-06 ENCOUNTER — Ambulatory Visit
Admission: RE | Admit: 2018-05-06 | Discharge: 2018-05-06 | Disposition: A | Discharge status: Home / Self Care | Payer: Medicare Other | Attending: Unknown Physician Specialty | Admitting: Unknown Physician Specialty

## 2018-05-06 ENCOUNTER — Encounter
Admission: RE | Disposition: A | Discharge status: Home / Self Care | Payer: Self-pay | Source: Home / Self Care | Attending: Unknown Physician Specialty

## 2018-05-06 DIAGNOSIS — Z85828 Personal history of other malignant neoplasm of skin: Secondary | ICD-10-CM | POA: Diagnosis not present

## 2018-05-06 DIAGNOSIS — Z7982 Long term (current) use of aspirin: Secondary | ICD-10-CM | POA: Insufficient documentation

## 2018-05-06 DIAGNOSIS — K319 Disease of stomach and duodenum, unspecified: Secondary | ICD-10-CM | POA: Diagnosis not present

## 2018-05-06 DIAGNOSIS — Z7902 Long term (current) use of antithrombotics/antiplatelets: Secondary | ICD-10-CM | POA: Diagnosis not present

## 2018-05-06 DIAGNOSIS — Z79899 Other long term (current) drug therapy: Secondary | ICD-10-CM | POA: Diagnosis not present

## 2018-05-06 DIAGNOSIS — Z7951 Long term (current) use of inhaled steroids: Secondary | ICD-10-CM | POA: Insufficient documentation

## 2018-05-06 DIAGNOSIS — I2510 Atherosclerotic heart disease of native coronary artery without angina pectoris: Secondary | ICD-10-CM | POA: Insufficient documentation

## 2018-05-06 DIAGNOSIS — M1990 Unspecified osteoarthritis, unspecified site: Secondary | ICD-10-CM | POA: Diagnosis not present

## 2018-05-06 DIAGNOSIS — Z955 Presence of coronary angioplasty implant and graft: Secondary | ICD-10-CM | POA: Diagnosis not present

## 2018-05-06 DIAGNOSIS — Z1211 Encounter for screening for malignant neoplasm of colon: Secondary | ICD-10-CM | POA: Diagnosis not present

## 2018-05-06 DIAGNOSIS — Z794 Long term (current) use of insulin: Secondary | ICD-10-CM | POA: Insufficient documentation

## 2018-05-06 DIAGNOSIS — Z86010 Personal history of colonic polyps: Secondary | ICD-10-CM | POA: Insufficient documentation

## 2018-05-06 DIAGNOSIS — I10 Essential (primary) hypertension: Secondary | ICD-10-CM | POA: Insufficient documentation

## 2018-05-06 DIAGNOSIS — Z791 Long term (current) use of non-steroidal anti-inflammatories (NSAID): Secondary | ICD-10-CM | POA: Insufficient documentation

## 2018-05-06 DIAGNOSIS — K648 Other hemorrhoids: Secondary | ICD-10-CM | POA: Diagnosis not present

## 2018-05-06 DIAGNOSIS — N400 Enlarged prostate without lower urinary tract symptoms: Secondary | ICD-10-CM | POA: Insufficient documentation

## 2018-05-06 DIAGNOSIS — K30 Functional dyspepsia: Secondary | ICD-10-CM | POA: Diagnosis present

## 2018-05-06 DIAGNOSIS — E119 Type 2 diabetes mellitus without complications: Secondary | ICD-10-CM | POA: Insufficient documentation

## 2018-05-06 DIAGNOSIS — Z6834 Body mass index (BMI) 34.0-34.9, adult: Secondary | ICD-10-CM | POA: Insufficient documentation

## 2018-05-06 DIAGNOSIS — E669 Obesity, unspecified: Secondary | ICD-10-CM | POA: Diagnosis not present

## 2018-05-06 DIAGNOSIS — G4730 Sleep apnea, unspecified: Secondary | ICD-10-CM | POA: Diagnosis not present

## 2018-05-06 DIAGNOSIS — E785 Hyperlipidemia, unspecified: Secondary | ICD-10-CM | POA: Diagnosis not present

## 2018-05-06 HISTORY — PX: COLONOSCOPY WITH PROPOFOL: SHX5780

## 2018-05-06 HISTORY — DX: Atherosclerotic heart disease of native coronary artery without angina pectoris: I25.10

## 2018-05-06 HISTORY — PX: ESOPHAGOGASTRODUODENOSCOPY (EGD) WITH PROPOFOL: SHX5813

## 2018-05-06 HISTORY — DX: Sleep apnea, unspecified: G47.30

## 2018-05-06 HISTORY — DX: Iron deficiency anemia, unspecified: D50.9

## 2018-05-06 HISTORY — DX: Unspecified osteoarthritis, unspecified site: M19.90

## 2018-05-06 LAB — GLUCOSE, CAPILLARY
Glucose-Capillary: 88 mg/dL (ref 70–99)
Glucose-Capillary: 93 mg/dL (ref 70–99)

## 2018-05-06 SURGERY — COLONOSCOPY WITH PROPOFOL
Anesthesia: General

## 2018-05-06 MED ORDER — BUTAMBEN-TETRACAINE-BENZOCAINE 2-2-14 % EX AERO
INHALATION_SPRAY | CUTANEOUS | Status: DC | PRN
  Administered 2018-05-06: 2 via TOPICAL

## 2018-05-06 MED ORDER — MIDAZOLAM HCL 2 MG/2ML IJ SOLN
INTRAMUSCULAR | Status: AC
  Filled 2018-05-06: qty 2

## 2018-05-06 MED ORDER — LIDOCAINE HCL (CARDIAC) PF 100 MG/5ML IV SOSY
PREFILLED_SYRINGE | INTRAVENOUS | Status: DC | PRN
  Administered 2018-05-06: 30 mg via INTRAVENOUS

## 2018-05-06 MED ORDER — PROPOFOL 500 MG/50ML IV EMUL
INTRAVENOUS | Status: DC | PRN
  Administered 2018-05-06: 120 ug/kg/min via INTRAVENOUS

## 2018-05-06 MED ORDER — SODIUM CHLORIDE 0.9 % IV SOLN
INTRAVENOUS | Status: DC
  Administered 2018-05-06: 07:00:00 via INTRAVENOUS

## 2018-05-06 MED ORDER — FENTANYL CITRATE (PF) 100 MCG/2ML IJ SOLN
INTRAMUSCULAR | Status: DC | PRN
  Administered 2018-05-06: 50 ug via INTRAVENOUS
  Administered 2018-05-06 (×2): 25 ug via INTRAVENOUS

## 2018-05-06 MED ORDER — MIDAZOLAM HCL 2 MG/2ML IJ SOLN
INTRAMUSCULAR | Status: DC | PRN
  Administered 2018-05-06: 2 mg via INTRAVENOUS

## 2018-05-06 MED ORDER — FENTANYL CITRATE (PF) 100 MCG/2ML IJ SOLN
INTRAMUSCULAR | Status: AC
  Filled 2018-05-06: qty 2

## 2018-05-06 MED ORDER — PROPOFOL 500 MG/50ML IV EMUL
INTRAVENOUS | Status: AC
  Filled 2018-05-06: qty 50

## 2018-05-06 MED ORDER — SODIUM CHLORIDE 0.9 % IV SOLN: INTRAVENOUS | Status: DC

## 2018-05-06 NOTE — H&P (Signed)
Primary Care Physician:  Glendon Axe, MD Primary Gastroenterologist:  Dr. Vira Agar  Pre-Procedure History & Physical: HPI:  Brandon Maxwell is a 73 y.o. male is here for an endoscopy and colonoscopy.  Previous procedures 02/20/2013, AVM was treated from the proximal transverse colon.   Past Medical History:  Diagnosis Date  . Anemia   . BPH (benign prostatic hyperplasia)   . BPH (benign prostatic hyperplasia)   . Coronary artery disease   . Diabetes ([place])   . DJD (degenerative joint disease)   . Hematospermia   . HLD (hyperlipidemia)   . Hypertension   . IDA (iron deficiency anemia)   . Obesity   . Prostatitis   . Skin cancer, basal cell   . Sleep apnea     Past Surgical History:  Procedure Laterality Date  . CORONARY ANGIOPLASTY WITH STENT PLACEMENT    . EYE SURGERY Bilateral    glaucoma  . GLAUCOMA SURGERY    . rectal fissure repair      Prior to Admission medications   Medication Sig Start Date End Date Taking? Authorizing Provider  carvedilol (COREG) 3.125 MG tablet Take 3.125 mg by mouth 2 (two) times daily with a meal.   Yes [provider]  ferrous sulfate 325 (65 FE) MG tablet Take by mouth.   Yes [provider]  finasteride (PROSCAR) 5 MG tablet Take 1 tablet (5 mg total) by mouth daily. 11/22/17  Yes McGowan, Larene Beach A, PA-C  gabapentin (NEURONTIN) 400 MG capsule  04/16/15  Yes [provider]  hydrochlorothiazide (MICROZIDE) 12.5 MG capsule TAKE ONE TABLET BY MOUTH EVERY DAY 10/21/14  Yes [provider]  insulin glargine (LANTUS) 100 UNIT/ML injection Inject 75 Units into the skin.  10/13/16 05/06/18 Yes [provider]  loperamide (IMODIUM) 2 MG capsule Take 2 mg by mouth as needed for diarrhea or loose stools (Take 1/2-1 tablet, up to 4x/day).   Yes [provider]  meloxicam (MOBIC) 7.5 MG tablet  03/29/15  Yes [provider]  metFORMIN (GLUCOPHAGE) 1000 MG tablet Take 1,000 mg by mouth 2 (two)  times daily with a meal. Reported on 05/26/2015   Yes [provider]  aspirin 81 MG tablet Take 81 mg by mouth daily.    [provider]  clopidogrel (PLAVIX) 75 MG tablet TAKE 1 TABLET BY MOUTH ONCE DAILY 08/08/16   [provider]  COMBIGAN 0.2-0.5 % ophthalmic solution  08/17/14   [provider]  dorzolamide (TRUSOPT) 2 % ophthalmic solution  06/17/14   [provider]  Dulaglutide (TRULICITY [place]) Inject into the skin.    [provider]  DULoxetine (CYMBALTA) 30 MG capsule Reported on 05/26/2015 11/04/14   [provider]  famotidine (PEPCID) 40 MG tablet Take 40 mg by mouth daily.    [provider]  glipiZIDE (GLUCOTROL XL) 5 MG 24 hr tablet  12/05/14   [provider]  ipratropium (ATROVENT) 0.03 % nasal spray  06/09/14   [provider]  latanoprost (XALATAN) 0.005 % ophthalmic solution Apply to eye. 06/30/14   [provider]  levocetirizine (XYZAL) 5 MG tablet Take by mouth. 04/20/15 04/19/16  [provider]  levocetirizine (XYZAL) 5 MG tablet TAKE 1 TABLET BY MOUTH  EVERY EVENING 11/07/16   [provider]  lovastatin (MEVACOR) 40 MG tablet Take 40 mg by mouth at bedtime.    [provider]  magnesium oxide (MAG-OX) 400 MG tablet Take by mouth. 07/12/16   [provider]  mometasone (ELOCON) 0.1 % lotion Reported on 05/26/2015 05/21/15   [provider]  Multiple Vitamin (MULTI-VITAMINS) TABS Take by mouth.    [provider]  Netarsudil Dimesylate (RHOPRESSA) 0.02 % SOLN  07/30/17   [provider]  pantoprazole (PROTONIX) 40 MG tablet Take by mouth. 06/30/14   [provider]  pioglitazone (ACTOS) 45 MG tablet Take 45 mg by mouth daily.    [provider]  sildenafil (VIAGRA) 100 MG tablet Take 1 tablet (100 mg total) by mouth daily as needed for erectile dysfunction. Take two hours prior to intercourse on an empty stomach  11/22/17   Zara Council A, PA-C  tamsulosin (FLOMAX) 0.4 MG CAPS capsule Take 1 capsule (0.4 mg total) by mouth daily. 11/22/17   Zara Council A, PA-C  TRULICITY 1.61 WR/6.0AV SOPN  12/05/14   [provider]  valsartan (DIOVAN) 80 MG tablet Take 80 mg by mouth daily.    [provider]    Allergies as of 03/14/2018 - Review Complete 11/22/2017  Allergen Reaction Noted  . Sulfa antibiotics  11/23/2014    Family History  Problem Relation Age of Onset  . Coronary artery disease Father   . Coronary artery disease Brother   . Nephrolithiasis Brother   . Kidney disease Neg Hx   . Prostate cancer Neg Hx   . Kidney cancer Neg Hx   . Bladder Cancer Neg Hx     Social History   Socioeconomic History  . Marital status: Single    Spouse name: Not on file  . Number of children: Not on file  . Years of education: Not on file  . Highest education level: Not on file  Occupational History  . Not on file  Social Needs  . Financial resource strain: Not on file  . Food insecurity:    Worry: Not on file    Inability: Not on file  . Transportation needs:    Medical: Not on file    Non-medical: Not on file  Tobacco Use  . Smoking status: Never Smoker  . Smokeless tobacco: Never Used  Substance and Sexual Activity  . Alcohol use: No    Alcohol/week: 0.0 standard drinks  . Drug use: No  . Sexual activity: Not on file  Lifestyle  . Physical activity:    Days per week: Not on file    Minutes per session: Not on file  . Stress: Not on file  Relationships  . Social connections:    Talks on phone: Not on file    Gets together: Not on file    Attends religious service: Not on file    Active member of club or organization: Not on file    Attends meetings of clubs or organizations: Not on file    Relationship status: Not on file  . Intimate partner violence:    Fear of current or ex partner: Not on file    Emotionally abused: Not on file    Physically abused: Not  on file    Forced sexual activity: Not on file  Other Topics Concern  . Not on file  Social History Narrative  . Not on file    Review of Systems: See HPI, otherwise negative ROS  Physical Exam: BP (!) 124/96   Pulse 84   Temp (!) 96.9 F (36.1 C) (Tympanic)   Resp 16   Ht 5\' 8"  (1.727 m)   Wt 104.3 kg   SpO2 98%  BMI 34.97 kg/m  General:   Alert,  pleasant and cooperative in NAD Head:  Normocephalic and atraumatic. Neck:  Supple; no masses or thyromegaly. Lungs:  Clear throughout to auscultation.    Heart:  Regular rate and rhythm. Abdomen:  Soft, nontender and nondistended. Normal bowel sounds, without guarding, and without rebound.   Neurologic:  Alert and  oriented x4;  grossly normal neurologically.  Impression/Plan: Brandon Maxwell is here for an endoscopy and colonoscopy to be performed for Personal history of colon polyps and dyspepsia   Risks, benefits, limitations, and alternatives regarding  endoscopy and colonoscopy have been reviewed with the patient.  Questions have been answered.  All parties agreeable.   Gaylyn Cheers, MD  05/06/2018, 7:33 AM

## 2018-05-06 NOTE — Anesthesia Preprocedure Evaluation (Signed)
Anesthesia Evaluation  Patient identified by MRN, date of birth, ID band Patient awake    Reviewed: Allergy & Precautions, H&P , NPO status , Patient's Chart, lab work & pertinent test results, reviewed documented beta blocker date and time   Airway Mallampati: II   Neck ROM: full    Dental  (+) Poor Dentition   Pulmonary sleep apnea and Continuous Positive Airway Pressure Ventilation ,    Pulmonary exam normal        Cardiovascular Exercise Tolerance: Poor hypertension, On Medications + CAD  Normal cardiovascular exam Rhythm:regular Rate:Normal     Neuro/Psych negative neurological ROS  negative psych ROS   GI/Hepatic negative GI ROS, Neg liver ROS,   Endo/Other  negative endocrine ROSdiabetes, Well Controlled, Type 2, Oral Hypoglycemic Agents  Renal/GU negative Renal ROS  negative genitourinary   Musculoskeletal   Abdominal   Peds  Hematology  (+) Blood dyscrasia, anemia ,   Anesthesia Other Findings Past Medical History: No date: Anemia No date: BPH (benign prostatic hyperplasia) No date: BPH (benign prostatic hyperplasia) No date: Coronary artery disease No date: Diabetes ([facility]) No date: DJD (degenerative joint disease) No date: Hematospermia No date: HLD (hyperlipidemia) No date: Hypertension No date: IDA (iron deficiency anemia) No date: Obesity No date: Prostatitis No date: Skin cancer, basal cell No date: Sleep apnea Past Surgical History: No date: CORONARY ANGIOPLASTY WITH STENT PLACEMENT No date: EYE SURGERY; Bilateral     Comment:  glaucoma No date: GLAUCOMA SURGERY No date: rectal fissure repair   Reproductive/Obstetrics negative OB ROS                             Anesthesia Physical Anesthesia Plan  ASA: III  Anesthesia Plan: General   Post-op Pain Management:    Induction:   PONV Risk Score and Plan:   Airway Management Planned:   Additional  Equipment:   Intra-op Plan:   Post-operative Plan:   Informed Consent: I have reviewed the patients History and Physical, chart, labs and discussed the procedure including the risks, benefits and alternatives for the proposed anesthesia with the patient or authorized representative who has indicated his/her understanding and acceptance.     Dental Advisory Given  Plan Discussed with: CRNA  Anesthesia Plan Comments:         Anesthesia Quick Evaluation

## 2018-05-06 NOTE — Op Note (Signed)
[facility] Gastroenterology Patient Name: Brandon Maxwell Procedure Date: 05/06/2018 7:38 AM MRN: [id number] Account #: [id number] Date of Birth: 1946-02-26 Admit Type: Outpatient Age: 73 Room: [facility] ENDO ROOM 1 Gender: Male Note Status: Finalized Procedure:            Upper GI endoscopy Indications:          Dyspepsia, Indigestion Providers:            Manya Silvas, MD Referring MD:         Glendon Axe (Referring MD) Medicines:            Propofol per Anesthesia Complications:        No immediate complications. Procedure:            Pre-Anesthesia Assessment:                       - After reviewing the risks and benefits, the patient                        was deemed in satisfactory condition to undergo the                        procedure.                       After obtaining informed consent, the endoscope was                        passed under direct vision. Throughout the procedure,                        the patient's blood pressure, pulse, and oxygen                        saturations were monitored continuously. The Endoscope                        was introduced through the mouth, and advanced to the                        second part of duodenum. The upper GI endoscopy was                        accomplished without difficulty. The patient tolerated                        the procedure well. Findings:      Localized mild erythema was found at the gastroesophageal junction.      Patchy mild inflammation characterized by erythema and granularity was       found in the gastric antrum. Biopsies were taken with a cold forceps for       histology. Biopsies were taken with a cold forceps for Helicobacter       pylori testing.      The examined duodenum was normal. Impression:           - Erythema at the gastroesophageal junction.                       - Gastritis. Biopsied.                       -  Normal examined duodenum. Recommendation:       -  Await pathology results. Manya Silvas, MD 05/06/2018 7:54:46 AM This report has been signed electronically. Number of Addenda: 0 Note Initiated On: 05/06/2018 7:38 AM      [facility]

## 2018-05-06 NOTE — Anesthesia Post-op Follow-up Note (Signed)
Anesthesia QCDR form completed.        

## 2018-05-06 NOTE — Anesthesia Postprocedure Evaluation (Signed)
Anesthesia Post Note  Patient: Brandon Maxwell  Procedure(s) Performed: COLONOSCOPY WITH PROPOFOL (N/A ) ESOPHAGOGASTRODUODENOSCOPY (EGD) WITH PROPOFOL (N/A )  Patient location during evaluation: PACU Anesthesia Type: General Level of consciousness: awake and alert Pain management: pain level controlled Vital Signs Assessment: post-procedure vital signs reviewed and stable Respiratory status: spontaneous breathing, nonlabored ventilation, respiratory function stable and patient connected to nasal cannula oxygen Cardiovascular status: blood pressure returned to baseline and stable Postop Assessment: no apparent nausea or vomiting Anesthetic complications: no     Last Vitals:  Vitals:   05/06/18 0820 05/06/18 0849  BP:    Pulse:    Resp: 16   Temp:  (!) 36.1 C  SpO2:      Last Pain:  Vitals:   05/06/18 0849  TempSrc:   PainSc: 0-No pain                 Molli Barrows

## 2018-05-06 NOTE — Anesthesia Procedure Notes (Signed)
Performed by: Cook-Martin, Raahi Korber Pre-anesthesia Checklist: Patient identified, Emergency Drugs available, Suction available, Patient being monitored and Timeout performed Oxygen Delivery Method: Supernova nasal CPAP Preoxygenation: Pre-oxygenation with 100% oxygen Induction Type: IV induction Airway Equipment and Method: Bite block Placement Confirmation: positive ETCO2 and CO2 detector       

## 2018-05-06 NOTE — Op Note (Signed)
[facility] Gastroenterology Patient Name: Brandon Maxwell Procedure Date: 05/06/2018 7:37 AM MRN: [id number] Account #: [id number] Date of Birth: Jan 22, 1946 Admit Type: Outpatient Age: 73 Room: [facility] ENDO ROOM 1 Gender: Male Note Status: Finalized Procedure:            Colonoscopy Indications:          Follow-up for history of adenomatous polyps in the colon Providers:            Manya Silvas, MD Referring MD:         Glendon Axe (Referring MD) Medicines:            Propofol per Anesthesia Complications:        No immediate complications. Procedure:            Pre-Anesthesia Assessment:                       - After reviewing the risks and benefits, the patient                        was deemed in satisfactory condition to undergo the                        procedure.                       After obtaining informed consent, the colonoscope was                        passed under direct vision. Throughout the procedure,                        the patient's blood pressure, pulse, and oxygen                        saturations were monitored continuously. The                        Colonoscope was introduced through the anus and                        advanced to the the cecum, identified by appendiceal                        orifice and ileocecal valve. The colonoscopy was                        performed without difficulty. The patient tolerated the                        procedure well. The quality of the bowel preparation                        was good. Findings:      Internal hemorrhoids were found during endoscopy. The hemorrhoids were       small, medium-sized and Grade I (internal hemorrhoids that do not       prolapse).      The exam was otherwise without abnormality. Impression:           - Internal hemorrhoids.                       -  The examination was otherwise normal.                       - No specimens collected. Recommendation:       - Repeat  colonoscopy in 5 years for surveillance. Manya Silvas, MD 05/06/2018 8:15:36 AM This report has been signed electronically. Number of Addenda: 0 Note Initiated On: 05/06/2018 7:37 AM Scope Withdrawal Time: 0 hours 6 minutes 14 seconds  Total Procedure Duration: 0 hours 14 minutes 46 seconds       [facility]

## 2018-05-06 NOTE — Transfer of Care (Signed)
Immediate Anesthesia Transfer of Care Note  Patient: Brandon Maxwell  Procedure(s) Performed: COLONOSCOPY WITH PROPOFOL (N/A ) ESOPHAGOGASTRODUODENOSCOPY (EGD) WITH PROPOFOL (N/A )  Patient Location: PACU  Anesthesia Type:General  Level of Consciousness: awake and sedated  Airway & Oxygen Therapy: Patient Spontanous Breathing and Patient connected to nasal cannula oxygen  Post-op Assessment: Report given to RN and Post -op Vital signs reviewed and stable  Post vital signs: Reviewed and stable  Last Vitals:  Vitals Value Taken Time  BP    Temp    Pulse    Resp    SpO2      Last Pain:  Vitals:   05/06/18 0655  TempSrc: Tympanic         Complications: No apparent anesthesia complications

## 2018-05-07 LAB — SURGICAL PATHOLOGY

## 2018-05-08 ENCOUNTER — Encounter: Payer: Self-pay | Admitting: Unknown Physician Specialty

## 2018-05-28 ENCOUNTER — Other Ambulatory Visit: Payer: Self-pay

## 2018-05-28 DIAGNOSIS — N401 Enlarged prostate with lower urinary tract symptoms: Principal | ICD-10-CM

## 2018-05-28 DIAGNOSIS — N138 Other obstructive and reflux uropathy: Secondary | ICD-10-CM

## 2018-05-29 ENCOUNTER — Other Ambulatory Visit: Payer: Medicare Other

## 2018-05-29 DIAGNOSIS — N401 Enlarged prostate with lower urinary tract symptoms: Principal | ICD-10-CM

## 2018-05-29 DIAGNOSIS — N138 Other obstructive and reflux uropathy: Secondary | ICD-10-CM

## 2018-05-29 NOTE — Progress Notes (Signed)
05/31/2018 9:40 AM   Brandon Maxwell 01-07-1946 [id number]  Referring provider: Glendon Axe, MD [facility] [place], [place] [address]  Chief Complaint  Patient presents with  . Benign Prostatic Hypertrophy    HPI: Brandon Maxwell is a 73 y.o. male Caucasian with a history of hematospermia and BPH with LUTS who presents today for a 6 month follow up.  BPH WITH LUTS  (prostate and/or bladder) IPSS score: 23/3  Previous score: 16/3  Previous PVR: 0 mL.  Major complaint(s): Frequency, urgency, and intermittency x several years. Denies any dysuria, hematuria or suprapubic pain.  Currently taking: Finasteride and tamsulosin.  His has had a cystoscopic examination in 12/2012 by Dr. Jacqlyn Larsen; it was noted that the prostatic urethra demonstrated moderate bilobar prostatic hypertrophy, partial visual obstruction, and prominent hypervascularity.  Non-contrast CT in 12/2016 was negative.  Patient reports today (05/31/2018) that he is feeling fine overall, and is most bothered by nocturia.  He reports he has to get up once a couple times a week, and a few times a month gets up more times.  He has trouble going back to sleep.  His leakage is primarily dribbling at the end of voiding.  He reports the frequency continues during the day and the urgency is associated with it.  He denies dysuria most of the time, hematuria, and feels like he is voiding completely.  He says he drinks daily about 24 oz diet Pepsi daily and some milk.  He does not drink it close to when he goes to sleep.  He also admits that he occasionally drinks tea, and denies drinking juices, energy drinks, and alcohol.  Denies any recent fevers, chills, nausea or vomiting.  He does not have a family history of PCa.  IPSS    Row Name 05/31/18 0800         International Prostate Symptom Score   How often have you had the sensation of not emptying your bladder?  Less than half the time     How often  have you had to urinate less than every two hours?  Almost always     How often have you found you stopped and started again several times when you urinated?  About half the time     How often have you found it difficult to postpone urination?  Almost always     How often have you had a weak urinary stream?  Less than half the time     How often have you had to strain to start urination?  Less than half the time     How many times did you typically get up at night to urinate?  4 Times     Total IPSS Score  23       Quality of Life due to urinary symptoms   If you were to spend the rest of your life with your urinary condition just the way it is now how would you feel about that?  Mixed        Score:  1-7 Mild 8-19 Moderate 20-35 Severe  Hematospermia: Patient has had intermittent episodes of hematospermia.  When he reduced the dose of his ASA from 325 mg to 81 mg, hematospermia did abate.  He is not having pain or curvature with ejaculation.  He reports on 05/31/2018 that it is still ongoing and he would like to see a physician regarding this issue.    Erectile dysfunction His SHIM score is  9, which is moderate ED.  His risk factors for ED are age, BPH, DM, HTN, HLD, sleep apnea, and CAD.  He denies any painful erections or curvatures with his erections.   He is still having having spontaneous erections but it is rare.  He has tried sildanafil in the past but found it ineffective.  He is interested in a trial of Cialis.  Patient reports that his penis has shortened recently and would like to see a physician.  SHIM    Row Name 05/31/18 [phone]         SHIM: Over the last 6 months:   How do you rate your confidence that you could get and keep an erection?  Very Low     When you had erections with sexual stimulation, how often were your erections hard enough for penetration (entering your partner)?  Almost Never or Never     During sexual intercourse, how often were you able to maintain your  erection after you had penetrated (entered) your partner?  A Few Times (much less than half the time)     During sexual intercourse, how difficult was it to maintain your erection to completion of intercourse?  Difficult     When you attempted sexual intercourse, how often was it satisfactory for you?  A Few Times (much less than half the time)       SHIM Total Score   SHIM  9        Score: 1-7 Severe ED 8-11 Moderate ED 12-16 Mild-Moderate ED 17-21 Mild ED 22-25 No ED  PMH: Past Medical History:  Diagnosis Date  . Anemia   . BPH (benign prostatic hyperplasia)   . BPH (benign prostatic hyperplasia)   . Coronary artery disease   . Diabetes ([place])   . DJD (degenerative joint disease)   . Hematospermia   . HLD (hyperlipidemia)   . Hypertension   . IDA (iron deficiency anemia)   . Obesity   . Prostatitis   . Skin cancer, basal cell   . Sleep apnea     Surgical History: Past Surgical History:  Procedure Laterality Date  . COLONOSCOPY WITH PROPOFOL N/A 05/06/2018   Procedure: COLONOSCOPY WITH PROPOFOL;  Surgeon: Manya Silvas, MD;  Location: [facility] ENDOSCOPY;  Service: Endoscopy;  Laterality: N/A;  . CORONARY ANGIOPLASTY WITH STENT PLACEMENT    . ESOPHAGOGASTRODUODENOSCOPY (EGD) WITH PROPOFOL N/A 05/06/2018   Procedure: ESOPHAGOGASTRODUODENOSCOPY (EGD) WITH PROPOFOL;  Surgeon: Manya Silvas, MD;  Location: [facility] ENDOSCOPY;  Service: Endoscopy;  Laterality: N/A;  . EYE SURGERY Bilateral    glaucoma  . GLAUCOMA SURGERY    . rectal fissure repair      Home Medications:  Allergies as of 05/31/2018      Reactions   Sulfa Antibiotics    Other reaction(s): UNSPECIFIED      Medication List       Accurate as of May 31, 2018  9:40 AM. Always use your most recent med list.        aspirin 81 MG tablet Take 81 mg by mouth daily.   carvedilol 3.125 MG tablet Commonly known as:  COREG Take 3.125 mg by mouth 2 (two) times daily with a meal.   clopidogrel 75 MG  tablet Commonly known as:  PLAVIX TAKE 1 TABLET BY MOUTH ONCE DAILY   COMBIGAN 0.2-0.5 % ophthalmic solution Generic drug:  brimonidine-timolol   dorzolamide 2 % ophthalmic solution Commonly known as:  TRUSOPT   DULoxetine 30 MG capsule  Commonly known as:  CYMBALTA Reported on 05/26/2015   famotidine 40 MG tablet Commonly known as:  PEPCID Take 40 mg by mouth daily.   ferrous sulfate 325 (65 FE) MG tablet Take by mouth.   finasteride 5 MG tablet Commonly known as:  PROSCAR Take 1 tablet (5 mg total) by mouth daily.   gabapentin 400 MG capsule Commonly known as:  NEURONTIN   glipiZIDE 5 MG 24 hr tablet Commonly known as:  GLUCOTROL XL   hydrochlorothiazide 12.5 MG capsule Commonly known as:  MICROZIDE TAKE ONE TABLET BY MOUTH EVERY DAY   insulin glargine 100 UNIT/ML injection Commonly known as:  LANTUS Inject 75 Units into the skin.   ipratropium 0.03 % nasal spray Commonly known as:  ATROVENT   latanoprost 0.005 % ophthalmic solution Commonly known as:  XALATAN Apply to eye.   levocetirizine 5 MG tablet Commonly known as:  XYZAL Take by mouth.   levocetirizine 5 MG tablet Commonly known as:  XYZAL TAKE 1 TABLET BY MOUTH  EVERY EVENING   loperamide 2 MG capsule Commonly known as:  IMODIUM Take 2 mg by mouth as needed for diarrhea or loose stools (Take 1/2-1 tablet, up to 4x/day).   lovastatin 40 MG tablet Commonly known as:  MEVACOR Take 40 mg by mouth at bedtime.   magnesium oxide 400 MG tablet Commonly known as:  MAG-OX Take by mouth.   meloxicam 7.5 MG tablet Commonly known as:  MOBIC   metFORMIN 1000 MG tablet Commonly known as:  GLUCOPHAGE Take 1,000 mg by mouth 2 (two) times daily with a meal. Reported on 05/26/2015   mometasone 0.1 % lotion Commonly known as:  ELOCON Reported on 05/26/2015   MULTI-VITAMINS Tabs Take by mouth.   omeprazole 40 MG capsule Commonly known as:  PRILOSEC Take by mouth daily.   pantoprazole 40 MG  tablet Commonly known as:  PROTONIX Take by mouth.   pioglitazone 45 MG tablet Commonly known as:  ACTOS Take 45 mg by mouth daily.   RHOPRESSA 0.02 % Soln Generic drug:  Netarsudil Dimesylate   sildenafil 100 MG tablet Commonly known as:  VIAGRA Take 1 tablet (100 mg total) by mouth daily as needed for erectile dysfunction. Take two hours prior to intercourse on an empty stomach   tadalafil 20 MG tablet Commonly known as:  ADCIRCA/CIALIS Take 1 tablet (20 mg total) by mouth daily as needed for erectile dysfunction.   tamsulosin 0.4 MG Caps capsule Commonly known as:  FLOMAX Take 1 capsule (0.4 mg total) by mouth daily.   TRULICITY [place] Inject into the skin.   TRULICITY 1.63 WG/6.6ZL Sopn Generic drug:  Dulaglutide   valsartan 80 MG tablet Commonly known as:  DIOVAN Take 80 mg by mouth daily.       Allergies:  Allergies  Allergen Reactions  . Sulfa Antibiotics     Other reaction(s): UNSPECIFIED    Family History: Family History  Problem Relation Age of Onset  . Coronary artery disease Father   . Coronary artery disease Brother   . Nephrolithiasis Brother   . Kidney disease Neg Hx   . Prostate cancer Neg Hx   . Kidney cancer Neg Hx   . Bladder Cancer Neg Hx     Social History:  reports that he has never smoked. He has never used smokeless tobacco. He reports that he does not drink alcohol or use drugs.  ROS: UROLOGY Frequent Urination?: Yes Hard to postpone urination?: Yes Burning/pain with urination?: No Get  up at night to urinate?: Yes Leakage of urine?: Yes Urine stream starts and stops?: Yes Trouble starting stream?: No Do you have to strain to urinate?: No Blood in urine?: No Urinary tract infection?: No Sexually transmitted disease?: No Injury to kidneys or bladder?: No Painful intercourse?: No Weak stream?: No Erection problems?: Yes Penile pain?: No  Gastrointestinal Nausea?: No Vomiting?: No Indigestion/heartburn?: No Diarrhea?:  No Constipation?: No  Constitutional Fever: No Night sweats?: No Weight loss?: No Fatigue?: No  Skin Skin rash/lesions?: No Itching?: No  Eyes Blurred vision?: Yes Double vision?: No  Ears/Nose/Throat Sore throat?: No Sinus problems?: No  Hematologic/Lymphatic Swollen glands?: No Easy bruising?: Yes  Cardiovascular Leg swelling?: No Chest pain?: No  Respiratory Cough?: No Shortness of breath?: No  Endocrine Excessive thirst?: No  Musculoskeletal Back pain?: Yes Joint pain?: No  Neurological Headaches?: No Dizziness?: No  Psychologic Depression?: No Anxiety?: No  Physical Exam: BP 132/73 (BP Location: Left Arm, Patient Position: Sitting)   Pulse 80   Ht 5\' 8"  (1.727 m)   Wt 235 lb (106.6 kg)   BMI 35.73 kg/m   Constitutional:  Well nourished. Alert and oriented, No acute distress. Guarded.   Cardiovascular: No clubbing, cyanosis, or edema. Respiratory: Normal respiratory effort, no increased work of breathing. GU: No CVA tenderness.  No bladder fullness or masses.  Patient with uncircumcised phallus.  Foreskin easily retracted.  Urethral meatus is patent.  No penile discharge. No penile lesions or rashes. Scrotum without lesions, cysts, rashes and/or edema.  Testicles are located scrotally bilaterally. No masses are appreciated in the testicles. Left and right epididymis are normal. Rectal: Patient with normal sphincter tone.  Anus and perineum without scarring or rashes.  No rectal masses are appreciated. Prostate is approximately 35 grams, could only palpate apex due to clenched buttocks, no nodules are appreciated.   Skin: No rashes, bruises or suspicious lesions. Lymph: No inguinal adenopathy. Neurologic: Grossly intact, no focal deficits, moving all 4 extremities. Psychiatric: Normal mood and affect.  Laboratory Data: PSA    1.2 (2.4) ng/mL on 05/25/2014             0.5 (1.0) ng/mL on 11/23/2014             0.8 (1.6) ng/mL on 11/19/2015              0.8 (1.6) ng/mL on 11/15/2016  0.4 (0.8) ng/mL on 11/2017  0.3 (0.6) ng/mL on 05/31/2018  I have reviewed the labs  Assessment & Plan:    1. BPH (benign prostatic hyperplasia) with LU TS - IPSS score is 23/3, it is worsening - Continue conservative management, avoiding bladder irritants and timed voiding's - avoiding diet sodas and increasing his water intake - Most bothersome symptoms is/are ED - Continue tamsulosin 0.4 mg and finasteride 5 mg daily; refills given - RTC in 6 months for IPSS, PSA and exam   2. Hematospermia - Still has dark colored semen would like to see a physician regarding this issues - On ASA and Plavix - No gross hematuria  3. Erectile dysfunction - Discussed trying Cialis today; patient is agreeable - Cialis prescription sent in - Explained to the patient that to my knowledge there are not successful penile lengthening procedures -Explained that a penile prothesis would help with erections, but that men actually report loss of length after a prothesis is placed - he would like to see if he is a candidate for a prothesis and would like an appointment with one  of the physicians  - RTC in 6 months for SHIM and exam    Return for would like an appointment with one of the physicians to discuss penile prothesis and blood in his se.  Zara Council, PA-C  [facility] [address] [place],  [address] [phone]  I, Adele Schilder, am acting as a Education administrator for [employer], PA-C.   I have reviewed the above documentation for accuracy and completeness, and I agree with the above.    Zara Council, PA-C

## 2018-05-30 LAB — PSA: Prostate Specific Ag, Serum: 0.3 ng/mL (ref 0.0–4.0)

## 2018-05-31 ENCOUNTER — Ambulatory Visit: Payer: Medicare Other | Admitting: Urology

## 2018-05-31 ENCOUNTER — Encounter: Payer: Self-pay | Admitting: Urology

## 2018-05-31 VITALS — BP 132/73 | HR 80 | Ht 68.0 in | Wt 235.0 lb

## 2018-05-31 DIAGNOSIS — N401 Enlarged prostate with lower urinary tract symptoms: Secondary | ICD-10-CM

## 2018-05-31 DIAGNOSIS — R361 Hematospermia: Secondary | ICD-10-CM

## 2018-05-31 DIAGNOSIS — N138 Other obstructive and reflux uropathy: Secondary | ICD-10-CM | POA: Diagnosis not present

## 2018-05-31 DIAGNOSIS — N529 Male erectile dysfunction, unspecified: Secondary | ICD-10-CM | POA: Diagnosis not present

## 2018-05-31 MED ORDER — TAMSULOSIN HCL 0.4 MG PO CAPS: 0.4000 mg | ORAL_CAPSULE | Freq: Every day | ORAL | 3 refills | Status: AC

## 2018-05-31 MED ORDER — FINASTERIDE 5 MG PO TABS: 5.0000 mg | ORAL_TABLET | Freq: Every day | ORAL | 3 refills | Status: AC

## 2018-05-31 MED ORDER — TADALAFIL 20 MG PO TABS: 20.0000 mg | ORAL_TABLET | Freq: Every day | ORAL | 3 refills | Status: AC | PRN

## 2018-06-04 ENCOUNTER — Ambulatory Visit: Payer: Medicare Other | Admitting: Urology

## 2018-06-05 ENCOUNTER — Telehealth: Payer: Self-pay

## 2018-06-05 NOTE — Telephone Encounter (Signed)
Prior Auth sent for Tadalafil

## 2018-06-05 NOTE — Telephone Encounter (Signed)
Called pt informed him that tadalafil was denied by his insurance company, however provided pt with coupon from [facility] to get medication for $14.90.

## 2018-06-06 ENCOUNTER — Telehealth: Payer: Self-pay | Admitting: Urology

## 2018-06-06 NOTE — Telephone Encounter (Signed)
App made 

## 2018-06-06 NOTE — Telephone Encounter (Signed)
-----   Message from Royanne Foots, [place] sent at 06/04/2018  1:14 PM EST ----- Regarding: FW: Appointment Dr. Erlene Quan says that she is willing to do these prosthesis procedures so you can schedule with her. I don't think any of the other providers will do them ----- Message ----- From: Benard Halsted Sent: 06/04/2018  12:43 PM EST To: Royanne Foots, CMA Subject: FW: Appointment                                Can you direct me to the proper provider please? Does Erlene Quan see patient's for this?   Sharyn Lull ----- Message ----- From: Abbie Sons, MD Sent: 06/04/2018   8:57 AM EST To: Gerhard Perches Subject: Appointment                                    This patient saw Larene Beach on 2/28.  I do not perform penile prosthesis so would recommend rescheduling to 1 of the providers who does.  This is not an urgent appointment and would make next routine

## 2018-06-11 ENCOUNTER — Ambulatory Visit: Payer: Medicare Other | Admitting: Urology

## 2018-06-11 ENCOUNTER — Encounter: Payer: Self-pay | Admitting: Urology

## 2018-06-11 VITALS — BP 134/79 | HR 85 | Ht 68.0 in | Wt 234.0 lb

## 2018-06-11 DIAGNOSIS — N4883 Acquired buried penis: Secondary | ICD-10-CM

## 2018-06-11 DIAGNOSIS — N529 Male erectile dysfunction, unspecified: Secondary | ICD-10-CM

## 2018-06-11 DIAGNOSIS — R361 Hematospermia: Secondary | ICD-10-CM | POA: Diagnosis not present

## 2018-06-11 MED ORDER — SILDENAFIL CITRATE 20 MG PO TABS: 20.0000 mg | ORAL_TABLET | ORAL | 11 refills | Status: AC | PRN

## 2018-06-11 NOTE — Progress Notes (Signed)
06/11/2018  6:59 PM   Brandon Maxwell February 01, 1946 [id number]  Referring provider: Glendon Axe, MD [facility] [place], [place] [address]  Chief Complaint  Patient presents with  . Discuss Penile Prosthesis    HPI: Brandon Maxwell is a 73 yo M who returns today for the evaluation and management of erectile dysfunction.   He reports of blood in his semen.  This is been going on intermittently for many years.  His last cystoscopy was more than 5 years ago.  He is very concerned about this and would like to have additional evaluation.  PSA/rectal exams up-to-date.  Most recent urinalysis on 04/17/2018 without blood.  He denies any gross hematuria.  No alleviating or exacerbating symptoms.  He is on chronic aspirin and Plavix.  He has difficulties with obtaining an erection. He has tried sildanafil in the past but found it ineffective. ED medications gave him a partial erection. He has not been using any of the ED medications recently since it is not effective. Several months ago he tried Viagara in the morning. He did take the medication 1 hour prior to sexual intercourse. He denies any side-effects from [place].   He also reports of his penis length being shortened. He has also been gaining weight.   His risk factors for ED are age, BPH, DM, HTN, HLD, sleep apnea, and CAD.   His PSA is 0.3 from 05/29/2018.   PMH: Past Medical History:  Diagnosis Date  . Anemia   . BPH (benign prostatic hyperplasia)   . BPH (benign prostatic hyperplasia)   . Coronary artery disease   . Diabetes ([place])   . DJD (degenerative joint disease)   . Hematospermia   . HLD (hyperlipidemia)   . Hypertension   . IDA (iron deficiency anemia)   . Obesity   . Prostatitis   . Skin cancer, basal cell   . Sleep apnea     Surgical History: Past Surgical History:  Procedure Laterality Date  . COLONOSCOPY WITH PROPOFOL N/A 05/06/2018   Procedure: COLONOSCOPY WITH PROPOFOL;  Surgeon:  Manya Silvas, MD;  Location: [facility] ENDOSCOPY;  Service: Endoscopy;  Laterality: N/A;  . CORONARY ANGIOPLASTY WITH STENT PLACEMENT    . ESOPHAGOGASTRODUODENOSCOPY (EGD) WITH PROPOFOL N/A 05/06/2018   Procedure: ESOPHAGOGASTRODUODENOSCOPY (EGD) WITH PROPOFOL;  Surgeon: Manya Silvas, MD;  Location: [facility] ENDOSCOPY;  Service: Endoscopy;  Laterality: N/A;  . EYE SURGERY Bilateral    glaucoma  . GLAUCOMA SURGERY    . rectal fissure repair      Home Medications:  Allergies as of 06/11/2018      Reactions   Sulfa Antibiotics    Other reaction(s): UNSPECIFIED      Medication List       Accurate as of June 11, 2018  6:59 PM. Always use your most recent med list.        aspirin 81 MG tablet Take 81 mg by mouth daily.   carvedilol 3.125 MG tablet Commonly known as:  COREG Take 3.125 mg by mouth 2 (two) times daily with a meal.   clopidogrel 75 MG tablet Commonly known as:  PLAVIX TAKE 1 TABLET BY MOUTH ONCE DAILY   Combigan 0.2-0.5 % ophthalmic solution Generic drug:  brimonidine-timolol   dorzolamide 2 % ophthalmic solution Commonly known as:  TRUSOPT   DULoxetine 30 MG capsule Commonly known as:  CYMBALTA Reported on 05/26/2015   famotidine 40 MG tablet Commonly known as:  PEPCID Take 40  mg by mouth daily.   ferrous sulfate 325 (65 FE) MG tablet Take by mouth.   finasteride 5 MG tablet Commonly known as:  PROSCAR Take 1 tablet (5 mg total) by mouth daily.   gabapentin 400 MG capsule Commonly known as:  NEURONTIN   glipiZIDE 5 MG 24 hr tablet Commonly known as:  GLUCOTROL XL   hydrochlorothiazide 12.5 MG capsule Commonly known as:  MICROZIDE TAKE ONE TABLET BY MOUTH EVERY DAY   insulin glargine 100 UNIT/ML injection Commonly known as:  LANTUS Inject 75 Units into the skin.   ipratropium 0.03 % nasal spray Commonly known as:  ATROVENT   latanoprost 0.005 % ophthalmic solution Commonly known as:  XALATAN Apply to eye.   levocetirizine 5 MG  tablet Commonly known as:  XYZAL Take by mouth.   levocetirizine 5 MG tablet Commonly known as:  XYZAL TAKE 1 TABLET BY MOUTH  EVERY EVENING   loperamide 2 MG capsule Commonly known as:  IMODIUM Take 2 mg by mouth as needed for diarrhea or loose stools (Take 1/2-1 tablet, up to 4x/day).   lovastatin 40 MG tablet Commonly known as:  MEVACOR Take 40 mg by mouth at bedtime.   magnesium oxide 400 MG tablet Commonly known as:  MAG-OX Take by mouth.   meloxicam 7.5 MG tablet Commonly known as:  MOBIC   metFORMIN 1000 MG tablet Commonly known as:  GLUCOPHAGE Take 1,000 mg by mouth 2 (two) times daily with a meal. Reported on 05/26/2015   mometasone 0.1 % lotion Commonly known as:  ELOCON Reported on 05/26/2015   Multi-Vitamins Tabs Take by mouth.   omeprazole 40 MG capsule Commonly known as:  PRILOSEC Take by mouth daily.   pantoprazole 40 MG tablet Commonly known as:  PROTONIX Take by mouth.   pioglitazone 45 MG tablet Commonly known as:  ACTOS Take 45 mg by mouth daily.   Rhopressa 0.02 % Soln Generic drug:  Netarsudil Dimesylate   sildenafil 100 MG tablet Commonly known as:  VIAGRA Take 1 tablet (100 mg total) by mouth daily as needed for erectile dysfunction. Take two hours prior to intercourse on an empty stomach   sildenafil 20 MG tablet Commonly known as:  Revatio Take 1 tablet (20 mg total) by mouth as needed. Take 1-5 tabs as needed prior to intercourse   tadalafil 20 MG tablet Commonly known as:  ADCIRCA/CIALIS Take 1 tablet (20 mg total) by mouth daily as needed for erectile dysfunction.   tamsulosin 0.4 MG Caps capsule Commonly known as:  FLOMAX Take 1 capsule (0.4 mg total) by mouth daily.   TRULICITY [place] Inject into the skin.   Trulicity 5.40 GQ/6.7YP Sopn Generic drug:  Dulaglutide   valsartan 80 MG tablet Commonly known as:  DIOVAN Take 80 mg by mouth daily.       Allergies:  Allergies  Allergen Reactions  . Sulfa Antibiotics      Other reaction(s): UNSPECIFIED    Family History: Family History  Problem Relation Age of Onset  . Coronary artery disease Father   . Coronary artery disease Brother   . Nephrolithiasis Brother   . Kidney disease Neg Hx   . Prostate cancer Neg Hx   . Kidney cancer Neg Hx   . Bladder Cancer Neg Hx     Social History:  reports that he has never smoked. He has never used smokeless tobacco. He reports that he does not drink alcohol or use drugs.  ROS: UROLOGY Frequent Urination?: Yes Hard  to postpone urination?: Yes Burning/pain with urination?: No Get up at night to urinate?: Yes Leakage of urine?: No Urine stream starts and stops?: Yes Trouble starting stream?: No Do you have to strain to urinate?: No Blood in urine?: No Urinary tract infection?: No Sexually transmitted disease?: No Injury to kidneys or bladder?: No Painful intercourse?: No Weak stream?: No Erection problems?: Yes Penile pain?: No  Gastrointestinal Nausea?: No Vomiting?: No Indigestion/heartburn?: No Diarrhea?: No Constipation?: No  Constitutional Fever: No Night sweats?: No Weight loss?: No Fatigue?: No  Skin Skin rash/lesions?: No Itching?: No  Eyes Blurred vision?: Yes Double vision?: No  Ears/Nose/Throat Sore throat?: No Sinus problems?: No  Hematologic/Lymphatic Swollen glands?: No Easy bruising?: Yes  Cardiovascular Leg swelling?: No Chest pain?: No  Respiratory Cough?: No Shortness of breath?: No  Endocrine Excessive thirst?: No  Musculoskeletal Back pain?: Yes Joint pain?: No  Neurological Headaches?: No Dizziness?: No  Psychologic Depression?: No Anxiety?: No  Physical Exam: BP 134/79 (BP Location: Left Arm, Patient Position: Sitting)   Pulse 85   Ht 5\' 8"  (1.727 m)   Wt 234 lb (106.1 kg)   BMI 35.58 kg/m   Constitutional:  Alert and oriented, No acute distress. HEENT: [place] AT, moist mucus membranes.  Trachea midline, no masses. Cardiovascular: No  clubbing, cyanosis, or edema. Respiratory: Normal respiratory effort, no increased work of breathing. Abdomen: Obese  skin: No rashes, bruises or suspicious lesions. Neurologic: Grossly intact, no focal deficits, moving all 4 extremities. Psychiatric: Normal mood and affect.  Assessment & Plan:    1. Erectile dysfunction Advised pt to take max dose of 5 tablets of Sildenafil 20 mg before further treatment options including injections and penile prosthesis. Advise pt to take Sildenafil 1 hour prior to sexual intercourse and on an empty stomach in order to optimize medication ensure that he is using the max dose optimally We discussed alternatives to PDE 5 inhibitors including vacuum erection device, intracavernosal injections, and IUD placement He was given literature specifically today on IPP, but understands that this would not help his penile length and likely decrease his overall length. He will try to optimize PDE 5 inhibitors and if this fails, he may be interested in trying injections.  We discussed that if he does not like to pursue this, we would arrange for test dose as well as injection teaching.  2. Hematospermia  Will reaccess at time of cystoscopy  F/u for next available cystoscopy   I explained to the patient some of the conditions that may cause hematospermia, such as: disorders of the prostate gland, seminal vesicles, spermatic cord, and ejaculatory duct system; urogenital infections including sexually transmitted infections (eg, chlamydia, herpes simplex virus, gonorrhea, trichomonas); metastatic cancers; vascular malformations; congenital and drug-induced bleeding disorders; and even frequent daily ejaculation over a period of several weeks.  I reassured him that his exam was normal and that we may never discover a reason for his hematospermia and it is most likely a benign symptom.     3.  Buried penis Patient is concerned today about decreasing penile length over the  years Explained to pt penile length may be shortened due to illusion of shortened penis as a result of increased growth of fat pads or reduced elasticity given the age of pt   Return for cysto.  [facility] [address], [place], [place] [address] [phone]  I, Lucas Mallow, am acting as a scribe for Dr. Hollice Espy,  I have reviewed the  above documentation for accuracy and completeness, and I agree with the above.   Hollice Espy, MD  I spent 25 min with this patient of which greater than 50% was spent in counseling and coordination of care with the patient.

## 2018-06-19 ENCOUNTER — Ambulatory Visit: Payer: Medicare Other | Admitting: Urology

## 2018-06-19 ENCOUNTER — Other Ambulatory Visit: Payer: Self-pay

## 2018-06-19 ENCOUNTER — Encounter: Payer: Self-pay | Admitting: Urology

## 2018-06-19 VITALS — BP 147/70 | HR 87 | Ht 68.0 in | Wt 237.0 lb

## 2018-06-19 DIAGNOSIS — R361 Hematospermia: Secondary | ICD-10-CM

## 2018-06-19 NOTE — Progress Notes (Signed)
   07/01/18  CC:  Chief Complaint  Patient presents with  . Cysto    HPI: 73 year old male who presents today for cystoscopy for further evaluation of ongoing hematospermia.  Please see previous notes for details.  Blood pressure (!) 147/70, pulse 87, height 5\' 8"  (1.727 m), weight 237 lb (107.5 kg). NED. A&Ox3.   No respiratory distress   Abd soft, NT, ND Normal phallus with bilateral descended testicles  Cystoscopy Procedure Note  Patient identification was confirmed, informed consent was obtained, and patient was prepped using Betadine solution.  Lidocaine jelly was administered per urethral meatus.     Pre-Procedure: - Inspection reveals a normal caliber ureteral meatus.  Procedure: The flexible cystoscope was introduced without difficulty - No urethral strictures/lesions are present. - Enlarged prostate trilobar coaptation, slightly friable vessels some bleeding with manipulation - Normal bladder neck - Bilateral ureteral orifices identified - Bladder mucosa  reveals no ulcers, tumors, or lesions - No bladder stones - Mild trabeculation  Retroflexion unremarkable   Post-Procedure: - Patient tolerated the procedure well  Assessment/ Plan:  1. Hematospermia Noscapine today fairly unremarkable He does have a mildly enlarged prostate which is slightly friable with manipulation,possibly the source of his hematospermia Patient was reassured, advised to call if he develops recurrent episodes - Urinalysis, Complete   Hollice Espy, MD

## 2018-06-20 LAB — URINALYSIS, COMPLETE
Bilirubin, UA: NEGATIVE
Ketones, UA: NEGATIVE
Leukocytes, UA: NEGATIVE
Nitrite, UA: NEGATIVE
Protein, UA: NEGATIVE
Specific Gravity, UA: 1.025 (ref 1.005–1.030)
UUROB: 0.2 mg/dL (ref 0.2–1.0)
pH, UA: 5 (ref 5.0–7.5)

## 2018-08-02 DEATH — deceased
